# Patient Record
Sex: Male | Born: 1970
Health system: Southern US, Community
[De-identification: ages and names within clinical notes are randomized; demographics above are authoritative.]

## PROBLEM LIST (undated history)

## (undated) DIAGNOSIS — M1990 Unspecified osteoarthritis, unspecified site: Secondary | ICD-10-CM

## (undated) DIAGNOSIS — R51 Headache: Secondary | ICD-10-CM

## (undated) DIAGNOSIS — Z8672 Personal history of thrombophlebitis: Secondary | ICD-10-CM

## (undated) DIAGNOSIS — M179 Osteoarthritis of knee, unspecified: Secondary | ICD-10-CM

## (undated) DIAGNOSIS — G4733 Obstructive sleep apnea (adult) (pediatric): Secondary | ICD-10-CM

## (undated) DIAGNOSIS — G43909 Migraine, unspecified, not intractable, without status migrainosus: Secondary | ICD-10-CM

## (undated) DIAGNOSIS — G8929 Other chronic pain: Secondary | ICD-10-CM

## (undated) DIAGNOSIS — E669 Obesity, unspecified: Secondary | ICD-10-CM

## (undated) DIAGNOSIS — M545 Low back pain, unspecified: Secondary | ICD-10-CM

## (undated) DIAGNOSIS — R519 Headache, unspecified: Secondary | ICD-10-CM

## (undated) DIAGNOSIS — I10 Essential (primary) hypertension: Secondary | ICD-10-CM

## (undated) DIAGNOSIS — Z86718 Personal history of other venous thrombosis and embolism: Secondary | ICD-10-CM

## (undated) DIAGNOSIS — Z87442 Personal history of urinary calculi: Secondary | ICD-10-CM

## (undated) DIAGNOSIS — I82629 Acute embolism and thrombosis of deep veins of unspecified upper extremity: Secondary | ICD-10-CM

## (undated) DIAGNOSIS — M1710 Unilateral primary osteoarthritis, unspecified knee: Secondary | ICD-10-CM

## (undated) HISTORY — DX: Essential (primary) hypertension: I10

## (undated) HISTORY — PX: JOINT REPLACEMENT: SHX530

## (undated) HISTORY — PX: CARPAL TUNNEL RELEASE: SHX101

## (undated) HISTORY — PX: INCISION AND DRAINAGE OF WOUND: SHX1803

## (undated) HISTORY — PX: LITHOTRIPSY: SUR834

## (undated) HISTORY — PX: LUMBAR SPINE HARDWARE REMOVAL: SHX1987

## (undated) HISTORY — PX: LUMBAR DISC SURGERY: SHX700

## (undated) HISTORY — DX: Personal history of thrombophlebitis: Z86.72

## (undated) HISTORY — DX: Obesity, unspecified: E66.9

## (undated) HISTORY — DX: Acute embolism and thrombosis of deep veins of unspecified upper extremity: I82.629

## (undated) HISTORY — PX: POSTERIOR LUMBAR FUSION: SHX6036

## (undated) HISTORY — PX: BACK SURGERY: SHX140

---

## 2003-09-01 ENCOUNTER — Encounter: Admission: RE | Admit: 2003-09-01 | Discharge: 2003-09-01 | Payer: Self-pay | Admitting: Orthopedic Surgery

## 2003-09-15 ENCOUNTER — Encounter: Admission: RE | Admit: 2003-09-15 | Discharge: 2003-09-15 | Payer: Self-pay | Admitting: Orthopedic Surgery

## 2003-09-30 ENCOUNTER — Encounter: Admission: RE | Admit: 2003-09-30 | Discharge: 2003-09-30 | Payer: Self-pay | Admitting: Neurological Surgery

## 2003-10-21 ENCOUNTER — Inpatient Hospital Stay (HOSPITAL_COMMUNITY): Admission: RE | Admit: 2003-10-21 | Discharge: 2003-10-23 | Payer: Self-pay | Admitting: Neurological Surgery

## 2003-11-17 ENCOUNTER — Encounter: Admission: RE | Admit: 2003-11-17 | Discharge: 2003-11-17 | Payer: Self-pay | Admitting: Neurological Surgery

## 2003-12-08 ENCOUNTER — Encounter: Admission: RE | Admit: 2003-12-08 | Discharge: 2003-12-08 | Payer: Self-pay | Admitting: Neurological Surgery

## 2004-02-09 ENCOUNTER — Encounter: Admission: RE | Admit: 2004-02-09 | Discharge: 2004-02-09 | Payer: Self-pay | Admitting: Neurological Surgery

## 2004-03-21 ENCOUNTER — Encounter: Admission: RE | Admit: 2004-03-21 | Discharge: 2004-03-21 | Payer: Self-pay | Admitting: Neurological Surgery

## 2004-05-17 ENCOUNTER — Encounter: Admission: RE | Admit: 2004-05-17 | Discharge: 2004-05-17 | Payer: Self-pay | Admitting: Neurological Surgery

## 2004-07-11 ENCOUNTER — Encounter: Admission: RE | Admit: 2004-07-11 | Discharge: 2004-07-11 | Payer: Self-pay | Admitting: Neurological Surgery

## 2004-12-05 ENCOUNTER — Encounter: Admission: RE | Admit: 2004-12-05 | Discharge: 2004-12-05 | Payer: Self-pay | Admitting: Neurological Surgery

## 2006-07-02 ENCOUNTER — Ambulatory Visit: Admission: RE | Admit: 2006-07-02 | Discharge: 2006-07-02 | Payer: Self-pay | Admitting: Neurological Surgery

## 2006-08-24 ENCOUNTER — Inpatient Hospital Stay (HOSPITAL_COMMUNITY): Admission: RE | Admit: 2006-08-24 | Discharge: 2006-08-26 | Payer: Self-pay | Admitting: Neurological Surgery

## 2006-09-03 ENCOUNTER — Inpatient Hospital Stay (HOSPITAL_COMMUNITY): Admission: AD | Admit: 2006-09-03 | Discharge: 2006-09-07 | Payer: Self-pay | Admitting: Orthopedic Surgery

## 2006-09-18 ENCOUNTER — Encounter: Admission: RE | Admit: 2006-09-18 | Discharge: 2006-09-18 | Payer: Self-pay | Admitting: Neurological Surgery

## 2006-09-24 ENCOUNTER — Ambulatory Visit: Payer: Self-pay | Admitting: Cardiovascular Disease

## 2006-10-02 ENCOUNTER — Encounter: Admission: RE | Admit: 2006-10-02 | Discharge: 2006-10-02 | Payer: Self-pay | Admitting: Neurological Surgery

## 2006-11-05 ENCOUNTER — Ambulatory Visit: Payer: Self-pay

## 2006-11-05 ENCOUNTER — Ambulatory Visit: Payer: Self-pay | Admitting: Cardiovascular Disease

## 2006-11-05 LAB — CONVERTED CEMR LAB: INR: 1.9 (ref 0.9–2.0)

## 2006-12-19 ENCOUNTER — Ambulatory Visit: Payer: Self-pay

## 2007-04-24 ENCOUNTER — Ambulatory Visit: Payer: Self-pay | Admitting: Cardiovascular Disease

## 2007-04-24 ENCOUNTER — Ambulatory Visit: Payer: Self-pay

## 2007-06-17 ENCOUNTER — Ambulatory Visit: Payer: Self-pay | Admitting: Cardiology

## 2007-06-17 ENCOUNTER — Encounter: Admission: RE | Admit: 2007-06-17 | Discharge: 2007-06-17 | Payer: Self-pay | Admitting: Neurological Surgery

## 2007-06-17 ENCOUNTER — Ambulatory Visit: Payer: Self-pay

## 2007-09-17 ENCOUNTER — Encounter: Admission: RE | Admit: 2007-09-17 | Discharge: 2007-09-17 | Payer: Self-pay | Admitting: Neurological Surgery

## 2008-01-23 ENCOUNTER — Ambulatory Visit (HOSPITAL_COMMUNITY): Admission: RE | Admit: 2008-01-23 | Discharge: 2008-01-23 | Payer: Self-pay | Admitting: Neurological Surgery

## 2008-02-28 ENCOUNTER — Ambulatory Visit (HOSPITAL_COMMUNITY): Admission: RE | Admit: 2008-02-28 | Discharge: 2008-02-28 | Payer: Self-pay | Admitting: Neurological Surgery

## 2008-02-28 DIAGNOSIS — Z9889 Other specified postprocedural states: Secondary | ICD-10-CM | POA: Insufficient documentation

## 2008-04-24 HISTORY — PX: SHOULDER ARTHROSCOPY W/ ROTATOR CUFF REPAIR: SHX2400

## 2008-04-24 HISTORY — PX: FOREARM SURGERY: SHX651

## 2008-04-24 HISTORY — PX: HAND SURGERY: SHX662

## 2008-10-08 ENCOUNTER — Encounter: Admission: RE | Admit: 2008-10-08 | Discharge: 2008-10-08 | Payer: Self-pay | Admitting: Neurological Surgery

## 2008-12-16 DIAGNOSIS — Z8672 Personal history of thrombophlebitis: Secondary | ICD-10-CM | POA: Insufficient documentation

## 2008-12-16 DIAGNOSIS — G4733 Obstructive sleep apnea (adult) (pediatric): Secondary | ICD-10-CM | POA: Insufficient documentation

## 2008-12-16 DIAGNOSIS — I10 Essential (primary) hypertension: Secondary | ICD-10-CM | POA: Insufficient documentation

## 2008-12-16 DIAGNOSIS — M545 Low back pain, unspecified: Secondary | ICD-10-CM | POA: Insufficient documentation

## 2008-12-16 DIAGNOSIS — Z87898 Personal history of other specified conditions: Secondary | ICD-10-CM | POA: Insufficient documentation

## 2009-01-12 ENCOUNTER — Encounter: Admission: RE | Admit: 2009-01-12 | Discharge: 2009-01-12 | Payer: Self-pay | Admitting: Neurological Surgery

## 2010-01-10 ENCOUNTER — Encounter: Admission: RE | Admit: 2010-01-10 | Discharge: 2010-01-10 | Payer: Self-pay | Admitting: Neurological Surgery

## 2010-02-16 ENCOUNTER — Encounter: Admission: RE | Admit: 2010-02-16 | Discharge: 2010-02-16 | Payer: Self-pay | Admitting: Neurological Surgery

## 2010-03-25 ENCOUNTER — Ambulatory Visit (HOSPITAL_COMMUNITY)
Admission: RE | Admit: 2010-03-25 | Discharge: 2010-03-27 | Payer: Self-pay | Source: Home / Self Care | Admitting: Neurological Surgery

## 2010-03-30 ENCOUNTER — Encounter
Admission: RE | Admit: 2010-03-30 | Discharge: 2010-03-30 | Payer: Self-pay | Source: Home / Self Care | Admitting: Neurosurgery

## 2010-04-12 ENCOUNTER — Ambulatory Visit (HOSPITAL_COMMUNITY)
Admission: RE | Admit: 2010-04-12 | Discharge: 2010-04-12 | Payer: Self-pay | Source: Home / Self Care | Attending: Neurological Surgery | Admitting: Neurological Surgery

## 2010-04-12 ENCOUNTER — Encounter (INDEPENDENT_AMBULATORY_CARE_PROVIDER_SITE_OTHER): Payer: Self-pay | Admitting: Neurological Surgery

## 2010-04-28 ENCOUNTER — Ambulatory Visit
Admission: RE | Admit: 2010-04-28 | Discharge: 2010-04-28 | Payer: Self-pay | Source: Home / Self Care | Attending: Cardiovascular Disease | Admitting: Cardiovascular Disease

## 2010-05-23 ENCOUNTER — Other Ambulatory Visit: Payer: Self-pay | Admitting: Neurological Surgery

## 2010-05-23 DIAGNOSIS — M549 Dorsalgia, unspecified: Secondary | ICD-10-CM

## 2010-06-07 ENCOUNTER — Ambulatory Visit
Admission: RE | Admit: 2010-06-07 | Discharge: 2010-06-07 | Disposition: A | Discharge status: Home / Self Care | Payer: BC Managed Care – PPO | Source: Ambulatory Visit | Attending: Neurological Surgery | Admitting: Neurological Surgery

## 2010-06-07 DIAGNOSIS — M549 Dorsalgia, unspecified: Secondary | ICD-10-CM

## 2010-06-07 MED ORDER — GADOBENATE DIMEGLUMINE 529 MG/ML IV SOLN
20.0000 mL | Freq: Once | INTRAVENOUS | Status: AC | PRN
  Administered 2010-06-07: 20 mL via INTRAVENOUS

## 2010-07-04 LAB — POCT I-STAT 4, (NA,K, GLUC, HGB,HCT)
Glucose, Bld: 141 mg/dL — ABNORMAL HIGH (ref 70–99)
HCT: 40 % (ref 39.0–52.0)
Hemoglobin: 13.6 g/dL (ref 13.0–17.0)

## 2010-07-04 LAB — TYPE AND SCREEN

## 2010-07-05 LAB — DIFFERENTIAL
Basophils Relative: 1 % (ref 0–1)
Eosinophils Relative: 8 % — ABNORMAL HIGH (ref 0–5)
Lymphs Abs: 2.5 10*3/uL (ref 0.7–4.0)
Monocytes Absolute: 0.4 10*3/uL (ref 0.1–1.0)
Monocytes Relative: 5 % (ref 3–12)
Neutrophils Relative %: 57 % (ref 43–77)

## 2010-07-05 LAB — BASIC METABOLIC PANEL
BUN: 7 mg/dL (ref 6–23)
CO2: 27 mEq/L (ref 19–32)
Chloride: 103 mEq/L (ref 96–112)
Creatinine, Ser: 0.81 mg/dL (ref 0.4–1.5)
GFR calc non Af Amer: >60 mL/min (ref >60)

## 2010-07-05 LAB — CBC
HCT: 44.8 % (ref 39.0–52.0)
Hemoglobin: 14.9 g/dL (ref 13.0–17.0)
MCHC: 33.3 g/dL (ref 30.0–36.0)
Platelets: 167 10*3/uL (ref 150–400)
RDW: 13 % (ref 11.5–15.5)

## 2010-07-05 LAB — PROTIME-INR
INR: 1.06 (ref 0.00–1.49)
Prothrombin Time: 14 seconds (ref 11.6–15.2)

## 2010-07-05 LAB — APTT: aPTT: 26 seconds (ref 24–37)

## 2010-07-05 LAB — SURGICAL PCR SCREEN: Staphylococcus aureus: POSITIVE — AB

## 2010-09-06 NOTE — Assessment & Plan Note (Signed)
[facility]                            CARDIOLOGY OFFICE NOTE   NAME:Joshua [name], Joshua [name]                       MRN:          [id number]  DATE:04/24/2007                            DOB:          1970/08/12    Joshua [name] returns today for followup.  He has had [name] right upper extremity  DVT.  This was in association with [name] right upper extremity medial PICC  line placed after back surgery.  I have been following him.  He has had  about 6 months of Coumadin.  He had [name] duplex today which I personally  read, time 10 minutes.  He has residual thrombus which is low in the arm  with resolution of all upper arm thrombus.  In particular, the  subclavian, axillary and brachial are free of thrombus.  He did have [name]  small thrombus burden residual in the basilic vein.   I told Joshua [name] that since the PICC line is now out and he has no proximal  thrombus that I would probably stop his Coumadin in March.  This will be  nine full months of anticoagulation   He is coming up from [facility] February 23 to see Joshua [name] about his  back.  I am not in the office at that time but I told him I would have  Joshua [name] see him and I could read his duplex study and we will make  [name] decision at the beginning of March, probably to stop his Coumadin   In general, the patient's upper extremity swelling has gone.  He has not  any significant chest pain.  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  His  review of systems remarkable for [name] significant headache.  He does get  migraines.  He usually takes Imitrex for this.   He has not had any shortness of breath, palpitations, evidence of PE, or  recurrent swelling in the right upper extremity.  He does continue have  chronic back problems and needs followup for this.   His review of systems otherwise negative.   His medications include Coumadin as directed by our clinic, Toprol-XL 50  [name] day, lisinopril 10 [name] day.   EXAMINATION TODAY:  Remarkable for [name]  jovial and slightly overweight  white male in no distress.  His blood pressure is 130/80 with [name] large  cuff in the left arm.  I did not take his blood pressure in the right  arm due to the history of DVT and basilic clot.  His heart rate is 70,  afebrile, respiratory rate 14, weight 280.  HEENT:  Unremarkable.  NECK:  Supple.  No lymphadenopathy, thyromegaly or JVP elevation.  No  fullness in the supraclavicular fossa.  LUNGS:  Clear, good diaphragmatic motion.  No wheezing.  S1, S2, with normal heart sounds.  PMI normal.  ABDOMEN:  Benign.  Bowel sounds positive.  No AAA, no tenderness, no  hepatosplenomegaly or hepatojugular reflux.  Distal pulses are intact, no edema.  NEUROLOGIC:  Nonfocal.  No muscular weakness.  The right upper extremity  shows no residual tenderness or swelling.  IMPRESSION:  1. Right upper extremity deep vein thrombosis, minimal thrombus burden      in the basilic vein and the antecubital fossa, likely to stop      Coumadin in March.  Followup duplex then.  We will then switch him      to an aspirin [name] day.  2. Hypertension, currently well controlled.  Continue lisinopril 10 mg      [name] day, low-salt diet.  3. Migraines.  Continue beta-blocker and Toprol-XL, which also helps      his blood pressure, Imitrex p.r.n.  Follow up with primary care      doctor.  4. Chronic back pain.  Follow up with Joshua [name] in February.  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   Overall, I think Joshua [name] is doing well and I think there is [name] 99% chance  we will stop his Coumadin in March.     Joshua [name]. Joshua Emms, MD, [facility]  Electronically Signed    PCN/MedQ  DD: 04/24/2007  DT: 04/24/2007  Job #: [id number]

## 2010-09-06 NOTE — Op Note (Signed)
NAMESHAWNTE, DEMAREST                ACCOUNT NO.:  [id number]   MEDICAL RECORD NO.:  [id number]          PATIENT TYPE:  AMB   LOCATION:  SDS                          FACILITY:  [facility]   PHYSICIAN:  Tia Alert, MD     DATE OF BIRTH:  1971-02-02   DATE OF PROCEDURE:  01/23/2008  DATE OF DISCHARGE:  01/23/2008                               OPERATIVE REPORT   PREOPERATIVE DIAGNOSIS:  Right carpal tunnel syndrome.   POSTOPERATIVE DIAGNOSIS:  Right carpal tunnel syndrome.   PROCEDURE:  Right carpal tunnel release.   SURGEON:  Tia Alert, MD   ASSISTANT:  None.   ANESTHESIA:  General endotracheal.   COMPLICATIONS:  None apparent.   INDICATION FOR THE PROCEDURE:  Mr. Burgard is a 40 year old gentleman who  complained of bilateral numbness in his hands.  He had nerve conduction  studies, which showed severe bilateral median neuropathies.  We  recommended a serial carpal tunnel release starting with the right side.  He understood the risks, benefits, and expected outcome and wished to  proceed.   DESCRIPTION OF THE PROCEDURE:  The patient was taken to the operating  room.  After induction of adequate generalized endotracheal anesthesia,  he was placed in a supine position with his right arm extended on an arm  board.  His arm was prepped circumferentially from the fingertips to the  axilla utilizing DuraPrep and then draped in the usual sterile fashion.  An 8 mL of local anesthesia was injected and a small palmar incision was  made in the palm from the distal wrist crease into the palm in line with  the middle finger.  It was carried down through the palmar fascia and  then the transverse carpal ligament was identified and opened in a  linear fashion with a #15 blade scalpel to expose the underlying median  nerve.  I then spread between the median nerve and the transverse carpal  ligament proximally under the distal wrist crease releasing the  transverse carpal ligament with micro  Metzenbaum scissors until it was  completely released.  I then palpated with the curved hemostat to assure  that I had complete transection of the transverse carpal ligament  proximally into the wrist.  I then went distally into the palm  transecting the transverse carpal ligament as I went until I reached the  palmar fat and the transverse carpal ligament was completely transected.  I then palpated along the nerve to assure that I had the transverse  carpal ligament fully released.  I then inspected the nerve, it looked  healthy and free.  I irrigated with saline solution, dried all bleeding  points, and then closed the palmar fascia with a single 3-0 Vicryl,  closed the subcutaneous tissues with 3-0 Vicryl sutures, and then  closed the skin with interrupted 4-0 Ethilon vertical mattress sutures.  The hand was then wrapped in a Kerlix and Ace bandage.  The patient was  then awakened from anesthesia and taken to the recovery room in stable  condition.  At the end of the procedure, all sponge, needle,  and  instrument counts were correct.      Tia Alert, MD  Electronically Signed     DSJ/MEDQ  D:  01/23/2008  T:  01/24/2008  Job:  [id number]

## 2010-09-06 NOTE — Assessment & Plan Note (Signed)
[facility]                            CARDIOLOGY OFFICE NOTE   NAME:[name], [name]                       MRN:          [id number]  DATE:06/17/2007                            DOB:          17-Jul-1970    Joshua [name] is an extremely pleasant 40 year old gentleman who is  typically followed by Dr. Eden [name].  He has a history of a right upper  extremity DVT occurring in the setting of a PICC line after back  surgery.  Dr. Eden [name] has been seeing him on a  routine basis and  ordering followup ultrasounds to reassess the clot burden.  He had an  ultrasound earlier today.  Note, since he was last seen, he denies any  dyspnea, chest pain, palpitations or syncope and there is no upper  extremity or lower extremity swelling.   PRESENT MEDICATIONS:  Include Coumadin as directed and followed by his  primary care physician, Toprol 50 mg daily, lisinopril 10 mg daily.   PHYSICAL EXAM:  Today shows a blood pressure 137/82.  His pulse is 86.  Weighs 296 pounds.  His HEENT is normal.  Neck is supple.  CHEST:  Clear.  CARDIOVASCULAR:  Regular rate.  ABDOMEN: Shows no tenderness.  His UPPER EXTREMITIES:  Show no edema and there are no cords palpated.  There are also no cords palpated in his lower extremities.   His electrocardiogram shows a sinus rhythm at a rate of 74.  The axis is  normal.  There are no ST changes noted.   We will also have a preliminary report from an upper extremity  ultrasound done earlier today.  There is residual nonocclusive thrombus  in a 6-8 cm segment of the basilic vein in the mid upper arm with  evidence of recanalization.   DIAGNOSES:  1. Upper extremity deep vein thrombosis - We will continue with his      Coumadin for now.  I will ask Dr. Eden [name] to review the upper      extremity Dopplers performed earlier today and then to contact the      patient about the duration of his Coumadin.  Dr. Eden [name] will also      inform him of his  next followup visit.  2. Hypertension - Blood pressure is adequately control.  3. History of migraine headaches - He will continue on his beta      blocker.  4. History of chronic back pain - Per his primary care physician.     Joshua Frieze [name], MD, [facility]  Electronically Signed   BSC/MedQ  DD: 06/17/2007  DT: 06/17/2007  Job #: [id number]

## 2010-09-06 NOTE — Op Note (Signed)
NAMESHERMAN, DONALDSON                ACCOUNT NO.:  [id number]   MEDICAL RECORD NO.:  [id number]          PATIENT TYPE:  AMB   LOCATION:  SDS                          FACILITY:  [facility]   PHYSICIAN:  Tia Alert, MD     DATE OF BIRTH:  03-13-1971   DATE OF PROCEDURE:  02/28/2008  DATE OF DISCHARGE:                               OPERATIVE REPORT   PREOPERATIVE DIAGNOSIS:  Left carpal tunnel syndrome.   POSTOPERATIVE DIAGNOSIS:  Left carpal tunnel syndrome.   PROCEDURE:  Left carpal tunnel release.   SURGEON:  Tia Alert, MD   ANESTHESIA:  Local MAC.   COMPLICATIONS:  None apparent.   INDICATIONS FOR PROCEDURE:  Mr. Carras is a very pleasant 40 year old  gentleman who presented with numbness and tingling in both hands.  He  was found to have severe carpal tunnel syndrome or median neuropathy  bilaterally.  He underwent a right carpal tunnel release about 6 weeks  ago.  He returns today for serial release of the left median nerve.  He  understands risks, benefits, expected outcome, and wishes to proceed.   DESCRIPTION OF PROCEDURE:  The patient was taken operating room and his  arm was extended on an arm board to the left.  His arm was prepped  circumferentially with DuraPrep from the fingertips to the axilla with  DuraPrep and then draped in the usual sterile fashion.  10 mL local  anesthesia was injected and a small palmar incision was made from the  distal wrist crease into the palm in line with the middle finger and  taken down through the palmar fascia.  Self-retaining retractor was  placed.  I then used the 15-blade scalpel to dissect down through the  transverse carpal ligament, it was quite thickened until the underlying  median nerve was exposed and I spread between the median nerve and the  transcarpal ligament with a mosquito dissector and continued to transect  the transcarpal ligament distally into the palm until the palmar fat was  reached and the transverse  carpal ligament was completely transected.  The underlying median nerve looked quite healthy and free.  I then did  the same thing more proximally under the wrist crease dissecting with  the mosquito and cutting the transcarpal ligament with micro Metzenbaum  scissors until it was completely released.  I then palpated with a  mosquito along the nerve each way to assure adequate decompression and  the nerve was quite free.  I then dried all bleeding points with bipolar  cautery.  I then closed the palmar fascia with a single 3-0 Vicryl  suture.  I then closed the subcuticular tissue with 3-0 Vicryl  suture and closed the skin with interrupted 4-0 Ethilon vertical  mattress sutures.  The hand was then wrapped in a Kerlix and an Ace  bandage, and the patient was taken to recovery room in stable condition.  At the end of the procedure, all sponge, needle, and instrument counts  were correct.      Tia Alert, MD  Electronically Signed  DSJ/MEDQ  D:  02/28/2008  T:  02/28/2008  Job:  [id number]

## 2010-09-06 NOTE — Assessment & Plan Note (Signed)
[facility]ALTHC[facility]                            CARDIOLOGY OFFICE NOTE   NAME:[name], [name]                       MRN:          [id number]  DATE:11/05/2006                            DOB:          [birth year]    Joshua [name] returns today for followup.  I initially saw him in June.  At  that time he had complication from a PICC line.  The PICC line had been  placed after previous back surgery.   He had a right upper extremity DVT.  Followup duplex in our office  showed some resolution of the right upper extremity DVT.  There was  subacute thrombus in the brachial vein at the level of the antecubital  fossa which was a new finding.   I told the patient he would be on Coumadin for at least 6 months to a  year.   He since then has had followup at Dr. Milta Deiters office in [place].  We  will check his pro time today.  He was also somewhat hypertensive, and  we started him on lisinopril 10 mg a day.   Currently he is doing well.  He continues to have some swelling and pain  in the right arm, but it has improved.   REVIEW OF SYSTEMS:  Otherwise negative.  In particular, he has not had  any significant shortness of breath or pleuritic chest pain.   PHYSICAL EXAMINATION:  VITAL SIGNS:  His weight today is 280.  Blood  pressure is 128/81, pulse 66, respiratory rate 14.  He is afebrile.  HEENT:  Normal.  NECK:  Carotids without bruit.  There is no JVP elevation, no  lymphadenopathy, no thyromegaly.  LUNGS:  Clear with good diaphragmatic motion, no wheezing.  CARDIAC:  There is normal S1 and S2 with normal heart sounds.  PMI is  normal.  ABDOMEN:  Benign.  There is no tenderness, no hepatosplenomegaly, no  hepatojugular reflux, no AAA.  Bowel sounds are positive.  EXTREMITIES:  Femoral pulses +3 bilaterally without bruits.  Distal  pulses intact with no edema.  NEUROLOGIC:  Nonfocal.  There is no muscular weakness.   MEDICATIONS:  1. He is currently on Coumadin as  directed.  2. Toprol 50 a day.  3. Lisinopril 10 a day.   IMPRESSION:  1. Deep vein thrombosis in the right arm, resolving.  Need for      Coumadin for another 6-12 months.  Check INR today and then follow      up with Dr. Milta Deiters office.  I will see him back in 3 months.  2. Hypertension.  Continue lisinopril and Toprol with low-salt diet.      Blood pressure currently well controlled at 128/81 in the office      today.  His pulse was 66.  Overall, I think that he has improved      and will continue this medication.  3. Chronic lower back problems.  He is to follow up with his surgeon      who did the disk surgery on him.  Apparently his hardware has been  removed, and he will need surveillance for repeat infections.   I will see the patient back in 3 months to further assess his blood  pressure and resolution of the DVT.     [name]. Eden Emms, MD, [facility]  Electronically Signed    PCN/MedQ  DD: 11/05/2006  DT: 11/05/2006  Job #: [id number]

## 2010-09-06 NOTE — Assessment & Plan Note (Signed)
[facility]                            CARDIOLOGY OFFICE NOTE   NAME:Joshua [name], Joshua [name]                       MRN:          [id number]  DATE:09/24/2006                            DOB:          02-12-1971    REFERRING PHYSICIAN:  Dr. [name]   The patient is [name] 40 year old patient.  As far as I can tell he was  referred for right upper extremity DVT.  I think the real issue here is  following his Coumadin levels.   The patient has [name] longstanding history of chronic lumbar problems.  On  May 2, he had lumbar disk surgery by Dr. [name] and this is his fourth or  fifth back surgery.   He subsequently had an infection.  The wound was debrided and he was  given [name] PICC line in the right upper extremity for 4 weeks for  antibiotics.  He subsequently developed [name] DVT.  I have [name] report from  [facility] indicating [name] significant right upper extremity DVT  involving the cephalic axial and brachial veins.   The patient had PICC line removed.  He was given Lovenox for [name] week.  He  was then given 5 mg of Coumadin.  He has had home health nurse check his  Coumadin level in [place].  He tells me that the last level was low at  1.6.   The patient lives in [place], but it is not really convenient to come  up here to [facility] to our Coumadin clinic and we will try to arrange  for the home health nurse to continue to check his Coumadin levels and  call the results to [place].   In regards to his right upper extremity DVT, he continues to have  significant swelling and pain here.  He has been putting heat on it at  night.  There has been no evidence for pleuritic pain, PE, shortness of  breath, PND, or orthopnea.  There are no other cardiac problems.   REVIEW OF SYSTEMS:  Remarkable for some fatigue, otherwise negative  except for significant right arm pain and his chronic back pain.   ROS:  Otherwise negative   PAST MEDICAL HISTORY:  Remarkable for multiple  disk surgeries.  The  first was in 1987 with an L3-4 fusion.  In 2005 he had ruptured disk  surgery with pins in the back.  He subsequently had hardware removed in  May of 2008 and an infection with wound repair on Sep 03, 2006.  Past  medical history is otherwise remarkable for some high blood pressure.  He has been on [name] medicine for this over the last few years.  There is no  history of coronary artery disease or valvular heart disease.   The patient is happily married.  He has three boys that he enjoys  watching baseball tournaments with.  He is [name] Naval architect.  He is out of  work currently.  He does not smoke or drink.  His wife's health is  fairly good.   FAMILY HISTORY:  Remarkable for mother and father being alive without  premature coronary artery disease.   ALLERGIES:  He denies any allergies.   MEDICATIONS:  1. Coumadin 5 mg [name] day.  2. Keflex 500 mg b.i.d.  3. Toprol 50 mg [name] day.  4. Lisinopril 10 mg [name] day.   PHYSICAL EXAMINATION:  GENERAL:  [name] healthy-appearing, young white male  in no distress.  Affect is appropriate.  VITAL SIGNS:  Blood pressure 140/80, pulse is elevated at 90,  respiratory rate is 14.  He is afebrile.  Weight is 270.  HEENT:  Normal.  There is no JVP elevation, no thyromegaly, and no  lymphadenopathy.  LUNGS:  Clear without wheezing.  Normal diaphragmatic motion.  HEART:  Normal S1 and S2 with normal heart sounds.  PMI is normal.  ABDOMEN:  Benign.  There is no hepatosplenomegaly, no hepatojugular  reflux, no AAA.  Bowel sounds positive.  There is no tenderness.  EXTREMITIES:  Femorals were +3 bilaterally without bruits.  PT's are +3  bilaterally.  There is no lower extremity edema or cords.  No evidence  of lower extremity DVT or varicosities.  NEUROLOGY:  Nonfocal.  MUSCULOSKELETAL:  No weakness.   The right upper extremity continues to be extremely painful,  particularly medially in the axilla area and also in the brachial area.  There is no  active sign of infection at the old PICC line site.  His  right radial pulse is +3.  He has tenderness to extension in the right  upper extremity.   His EKG is normal except for tachycardia at rate of 110.   IMPRESSION:  Problem 1.  Right upper extremity deep venous thrombosis  related to PICC line.  Primary issue is Coumadin follow-up.  His INR has  clearly been low.  He will have it checked today.  I suspect we will  need to increase him to 5 alternating with 7.5.  I will try to get his  home health nurse in [place] to continue to check his pro-time on [name]  weekly basis at home.  If his INR is less than 2 he will be given  another prescription for 1 mg/kg subcu b.i.d. of Lovenox.   The patient understands the need for good anticoagulation and risks for  pulmonary embolism.  I will see him back in 4-6 weeks and we will repeat  his right upper extremity Duplex to see if there has been resolution of  his thrombus and make sure there is no proximal extension.  On physical  examination, the only thing I am [name] little concerned about is his  tachycardia.   Further recommendations will be based on his pro-time results today.  Again, I think he needs to be on [name] higher dose of Coumadin and we will  try to arrange follow up of his INR's.   Problem 2.  Hypertension currently well controlled.  He may need [name]  higher dose of beta blocker.  He will continue his ACE inhibitor and we  will see what his heart rate does the next time we see him.   His general care for his chronic lower back problems will be continued  to be given by Dr. [name].     [name]. Eden Emms, MD, [facility]  Electronically Signed    PCN/MedQ  DD: 09/24/2006  DT: 09/24/2006  Job #: [id number]

## 2010-09-06 NOTE — Op Note (Signed)
NAMEHOMERO, Joshua [name]                ACCOUNT NO.:  [id number]   MEDICAL RECORD NO.:  [id number]          PATIENT TYPE:  INP   LOCATION:  6737                         FACILITY:  [facility]   PHYSICIAN:  Tia Alert, MD     DATE OF BIRTH:  1970/10/19   DATE OF PROCEDURE:  09/03/2006  DATE OF DISCHARGE:                               OPERATIVE REPORT   PREOPERATIVE DIAGNOSIS:  Postoperative lumbar wound fluid collection.   POSTOPERATIVE DIAGNOSIS:  Postoperative lumbar wound fluid collection  with superficial suture abscess at the superior part of the incision.   PROCEDURE:  Irrigation debridement of lumbar wound with removal of  serous fluid collection, culture of the wound and placement of  superficial and deep lumbar drains.   SURGEON:  Dr. Marikay Alar.   ASSISTANT:  Dr. Altamease Oiler.   ANESTHESIA:  General endotracheal.   COMPLICATIONS:  None apparent.   FINDINGS AT SURGERY:  On the initial incision there was exudate from the  very superficial top part of the incision.  This was cultured and sent  before antibiotics were given.  The incision was then opened and there  was release of serous fluid from the deeper tissues.  There was a lot of  dead space between the subcutaneous tissues and the fascia in between  the fascia and the scar tissue overlying the dura below.  The deeper  tissues appeared to be healthy and bleeding.  There was no evidence of  obvious infected tissue there.  We performed a Valsalva maneuver and saw  no evidence of CSF leak at any time during the surgery.  This was felt  to represent a serous fluid collection possibly from an inflammatory  reaction or it could be early wound infection.  Superficial and deep  cultures were sent.   INDICATIONS FOR PROCEDURE:  Joshua [name] is a 40 year old gentleman who  underwent a lumbar exploration with removal of hardware from L2-L5 with  a decompressive laminectomy at L1-2 and placement of Actifuse at L2-3  about 12 days ago.   About 5 days ago he presented with a superficial  fluid collection and the top part of the incision.  This was tapped and  70-75 mL of serous fluid was removed at that time. It was a dark red  wine color at that time.  He noticed a return of the fluid the following  day.  He then starting getting fevers over the weekend up to 102 without  drainage from the incision itself.  The incision looked pristine on  examination today in the office but he had a larger fluid collection.  This was tapped once again and 60 mL of a dark red wine-colored fluid  was again removed but he was more tender today and was febrile again.  He did not have any redness of the incision and no obvious frank  infection or dehiscence of the incision.  It was felt that he was best  treated by bringing him back to the operating room for exploration of  the wound to make sure this did not represent a CSF  leak or a deep wound  infection.  This would also allow underwent to culture the tissues  before antibiotics were given especially given the fact that he was now  having fevers.  He did have some headache but not necessarily postural  in nature.  We felt the potential risk of the surgery included but were  not limited to bleeding, infection and lack of relief of symptoms.  We  felt was the benefits of the surgery was we could place drains once  again and leave these in for an extended period time to try to prevent  reaccumulation of this serous fluid.  He understood the risks, benefits,  expected outcome and agreed to proceed.   DESCRIPTION OF PROCEDURE:  The patient was taken to operating room after  induction of adequate generalized endotracheal anesthesia he was rolled  in the prone position on the Wilson frame and all pressure points were  padded.  The incision was prepped with DuraPrep and draped in usual  sterile fashion.  The superior three-quarters of his incision was  reopened.  There was a small release of pus  from the very top of the  incision and this was felt to be a very superficial stitch abscess.  This was cultured.  We then opened the incision further there was a  release of fluid from the tissue between the subcutaneous tissues and  the fascia.  The fascia was opened very easily with a finger sweep and  there was release of more serous fluid.  The wound was inspected.  I  found no evidence of infection.  There was no pus the tissues were  healthy-looking.  I debrided the tissue somewhat just to get back to a  nice healthy bleeding looking tissues with the tissues looked quite  good.  We Valsalva the patient to make sure there was no CSF leak.  We  found none and then irrigated with 2 liters of bacitracin containing  saline solution by lavaging the wound and then placed two medium Hemovac  drains through separate stab incisions to run deep in the incision and  then closed the muscle as best I could to try to limit dead space.  I  then closed the fascia with 0-0 Vicryl.  I then placed another medium  Hemovac drain on top of the fascia and then closed the subcutaneous  tissues with 2-0 Vicryl and subcuticular tissues with 3-0 Vicryl.  The  drains were hooked to their Hemovacs.  The skin was closed with Benzoin  and Steri-Strips.  Drapes removed.  A sterile dressing was applied.  The  patient was awakened from general anesthesia and transferred recovery  room in stable condition.  At the end of the procedure all sponge,  needle and instrument counts were correct.      Tia Alert, MD  Electronically Signed     DSJ/MEDQ  D:  09/03/2006  T:  09/04/2006  Job:  [id number]

## 2010-09-06 NOTE — Op Note (Signed)
NAMEADA, WOODBURY                ACCOUNT NO.:  [id number]   MEDICAL RECORD NO.:  [id number]          PATIENT TYPE:  INP   LOCATION:  3172                         FACILITY:  [facility]   PHYSICIAN:  Tia Alert, MD     DATE OF BIRTH:  Aug 19, 1970   DATE OF PROCEDURE:  08/24/2006  DATE OF DISCHARGE:                               OPERATIVE REPORT   PREOPERATIVE DIAGNOSIS:  Back pain, status post instrumented fusion L2  to L5, with adjacent level stenosis, L1-2, with possible  pseudoarthrosis, L2-3.   POSTOPERATIVE DIAGNOSIS:  Back pain, status post instrumented fusion L2  to L5, with adjacent level stenosis, L1-2, with possible  pseudoarthrosis, L2-3, solid fusion suspected at L2-3.   PROCEDURES:  1. Exploration of fusion, L2 to L5.  2. Removal of segmental fixation, L2 to L5.  3. Decompressive laminectomy, L1-2 on the left, followed by a      sublaminar decompression, L1-2, for central canal decompression.  4. Intertransverse arthrodesis, L2,-3 utilizing Actifuse putty.   SURGEON:  Tia Alert, MD   ASSISTANT:  Kathaleen Maser. Pool, MD   ANESTHESIA:  General endotracheal.   COMPLICATIONS:  None apparent.   FINDINGS AT SURGERY:  There was a suspected pseudoarthrosis at L2-3.  However, we then inspected our fusion from L2 to L5.  We found no  pseudoarthrosis at L2-3.  We did find adjacent level stenosis at L1-2,  which was addressed with a decompressive laminectomy.   INDICATIONS FOR PROCEDURE:  Mr. Lavallee is a 40 year old gentleman who  about 2-1/2 years ago underwent a decompressive laminectomy with to TLIF  F L2-L3, L3-4 and or L4-5, followed by segmental fixation L2 to L5.  He  initially did quite well and over time developed chronic back pain.  He  had imaging which showed what was felt to be solid fusions at L3-4  and  L4-5 with possible pseudoarthrosis at L2-3 with some lucency around the  L2 screw on the right side.  He tried medical management for some time  without  significant relief and was on chronic narcotic pain medications.  I recommended a lumbar re-exploration with the removal of the hardware  and exploration of the fusion of L2-3.  His preoperative scan showed  adjacent level stenosis at L1-2.  I recommended that we address this  with a laminectomy at L1-2.  He understood the risks, benefits and  suspected outcome of this procedure and wished to proceed in hopes of  improving his pain situation.   DESCRIPTION OF PROCEDURE:  The patient was taken to the operating room  and after induction of adequate generalized endotracheal anesthesia, he  was rolled into the prone position on chest rolls and all pressure  points were padded.  His lumbar region was prepped with DuraPrep and  then draped in the usual sterile fashion.  Ten milliliters of local  anesthesia was injected and his old incision was ellipsed out.  The  incision was carried down to the fascia.  I took down the fascia and the  paraspinous musculature at L1 to expose L1-2 region, then carried  this  dissection out laterally until I found the L2 screw and then followed  this down, exposing the pedicle, screw and rod construct all the way  down to the L5 pedicle screws.  Self-retaining retractors were placed.  He had significant bony overgrowth over the pedicle screw at L2 on the  right, over the rod on both sides, and over the superior cross-link  between L2-3.  This had to be removed with a Leksell rongeur.  I was  then able to remove the locking caps from each of the screws, loosen the  transverse connectors and remove those.  I was then able to remove bony  overgrowth from the surface of the rod and remove the rods.  I then  tested each screw.  Each screw seemed to have a tight fit except the L2  screw on the right, which was somewhat loose in its pedicle.  I removed  the pedicle screws at L4 and L5 bilaterally.  I then tested each screw  at L2 and L3 by pulling on each screw and with  pulling on each  individual screw, all four screws moved as a single unit.  I then took  down the musculature lateral to the screws, found the intertransverse  arthrodesis, coagulated the soft tissues over this to expose the entire  arthrodesis laterally at L2-3.  I found no crack in the arthrodesis as  was suspected on the preoperative CT scan.  It appeared to be solid  completely from L2 to L3 bilaterally, and again testing the screws by  pulling individually on each screw made the four screws move as a single  unit.  Therefore, I felt like we had a solid arthrodesis at L2-3.  I did  expose the transverse processes expecting to place Actifuse out over  these to help ensure that we had a solid fusion at this level.  Once the  hardware was removed at L2 and L3, I then used the Leksell rongeur to  remove the soft tissues, exposing the lamina of L1, and then performed  an L1-2 hemilaminectomy on the left side, decompressing the lateral  recess and following this down to the superior part of scar tissue until  I could palpate the pedicle of L2, and once I was in the inferior part  of that pedicle stopped my decompression.  I also drilled up under the  spinous process and performed a sublaminar decompression, again  decompressing until I got down to the L2 pedicle and just below the L2  pedicle and once the guide below the L2 pedicles, I felt like I had a  nice decompression of the central canal.  I then irrigated with saline  solution containing bacitracin, tried to dry all bleeding points as best  as possible, lined the dura with Gelfoam, decorticated the transverse  processes bilaterally at L2-3, placed Actifuse out over these to perform  intertransverse arthrodesis, L2-3, again then tried to dry all bleeding  points, placed two medium Hemovac drains through separate stab incisions  and then closed the muscle and fascia with 0 Vicryl, closed the subcutaneous and subcuticular tissues with  2-0 and 3-0 Vicryl, and  closed the skin with Benzoin Steri-Strips.  The drapes were removed.  A  sterile dressing was applied.  The patient was awakened from anesthesia  and transported to the recovery room in stable condition.  At the end of  procedure all sponge, needle and instrument counts were correct.      Onalee Hua  Barnett Applebaum, MD  Electronically Signed     DSJ/MEDQ  D:  08/24/2006  T:  08/24/2006  Job:  [id number]

## 2010-09-06 NOTE — Letter (Signed)
April 28, 2010    Dr. Brent Bulla  [address]  [place], [place] [address]   RE:  Joshua [name], Joshua [name]  MRN:  [id number]  /  DOB:  11/17/1970   Dear Dr. Marina Goodell:   Thank you for referring Joshua [name] for further evaluation regarding right  upper extremity superficial vein thrombosis.  As you are aware, this is  a pleasant 40 year old gentleman with the following problem list:   1. History of chronic back problems that required multiple back      surgeries in the past most recently last month.  2. History of right upper extremity deep vein thrombosis in 2008 after      a PICC line placement which was treated with about 9 months of      anticoagulation with warfarin.  3. Hypertension.  4. Obesity.   CLINICAL HISTORY:  Mr. [name] has extensive back problems that required  multiple surgeries on his back most recently in early December by Dr.  [name] in [place].  At that time it was complicated with possible  postoperative epidural hematoma that required  exploration with removal  of a small hematoma.  He did fine and was discharged home.  About a week  after that he noticed slight discomfort in his right arm which was  tender to touch with some swelling below the elbow.  Due to his previous  history of DVT in that arm he was concerned, and he went back and saw  Dr. [name] who ordered an ultrasound Doppler.  The ultrasound showed  acute thrombus in the right cephalic and basilic veins that does not  involve the deep veins.  This was discovered on December 20.  Since then  actually the swelling and discomfort have improved on its own.  He is  completely functional in that arm, and does not seem to be tender to  touch.  He denies any dyspnea.  He does not have any other associated  symptoms.  He has no previous history of DVTs in the lower extremities.  There is no history of cancer or pulmonary embolus.   MEDICATIONS:  None.   SOCIAL HISTORY:  Is negative for smoking, alcohol or recreational  drug  use.  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   FAMILY HISTORY:  Negative for coronary artery disease.   REVIEW OF SYSTEMS:  This is remarkable for chronic back pain.  There is  slight discomfort in the right upper extremities which has improved.  A  full review of system was performed, and is, otherwise, negative.   PHYSICAL EXAMINATION:  GENERAL:  The patient is obese who is in no acute  distress.  Weight is 289 pounds.  HEENT:  Normocephalic, atraumatic.  NECK:  No JVD or carotid bruits.  RESPIRATORY:  Normal respiratory effort with no use of accessory  muscles.  Auscultation reveals normal breath sounds.  CARDIOVASCULAR:  Normal PMI.  Normal S1 and S2 with no gallops or murmurs.  ABDOMEN:  Benign, nontender, nondistended.  EXTREMITIES:  With no clubbing, cyanosis or edema.  Upper extremities:  The right upper extremity is not swollen.  It appears to be the same  size as the left upper extremity.  It is not tender to touch.  Radial  pulse is slightly reduced on the right side.  Palpation of the muscles  and veins does not reveal tenderness.  PSYCHIATRIC:  He is alert, oriented x3 with normal mood and affect.   IMPRESSION:  1. Thrombosis of the superficial veins of the upper extremity:  This      does not involve the deep veins as the brachial, axillary, and      subclavian are all patent with no evidence of clot.  His previous      clot in 2008 involved the axillary and the brachial veins as well.      The current veins that are involved are the cephalic and basilic or      superficial veins.  Actually even after treatment with      anticoagulation in 2008, a follow-up Doppler in 2009 showed      significant residual thrombus in the basilic vein.  Thus, I think      some of this might be actually old thrombus.  He also might have      substrate for future thrombus formation in that area.  Currently      his symptoms improved spontaneously, and by  physical exam I really      do not see any signs of swelling or discomfort.  I am concerned to      put him on anticoagulation given his recent back surgery that was      complicated by an epidural hematoma.  I think the risk outweighs      the benefit in this situation especially with improvement in his      symptoms and the fact that thrombus does not involve deep veins.      Thus I recommend aspirin 325 mg once daily.  I will plan on repeat      ultrasound Doppler in 2 months to make sure that there is no      extension into the deep veins that might at that time require      anticoagulation.  2. Hypertension:  The patient used to be on medications in the past.      He actually did not have his vitals checked today in the office by      mistake, and I recommend that he gets that addressed upon follow-      up.  The patient is going to follow up with me after his follow-up      ultrasound in 2 months from now.   Thank you for allowing me to participate in the care of your patient.    Sincerely,      Lorine Bears, MD  Electronically Signed    MA/MedQ  DD: 04/28/2010  DT: 04/28/2010  Job #: [id number]

## 2010-09-06 NOTE — Consult Note (Signed)
Joshua [name], Joshua [name]                ACCOUNT NO.:  [id number]   MEDICAL RECORD NO.:  [id number]          PATIENT TYPE:  INP   LOCATION:  6737                         FACILITY:  [facility]   PHYSICIAN:  Georga Hacking, M.D.DATE OF BIRTH:  [birth year]   DATE OF CONSULTATION:  DATE OF DISCHARGE:                                 CONSULTATION   I was asked to see this 40 year old male for evaluation of an arrhythmia  occurring during surgery.  The patient has a previous history of  hypertension, treated with Lisinopril and metoprolol but no previous  cardiac history and no history of arrhythmias.  He took his last dose of  Toprol yesterday morning.  He had a recent back surgery with removal of  hardware and has developed some infection in that area and was in need  of going to the OR this afternoon.  As stated his metoprolol was last  taken yesterday.  On induction of anesthesia he received Fentanyl pre-  induction and went into SVT at a rate of 204 spontaneously.  Blood  pressure remained stable.  He was awake and talking.  He spontaneously  went back to a sinus rhythm in the 100s, then recurred x4.  There was no  response to carotid massage or Valsalva.  There was a paper recorder  malfunction.  They could capture the strip.  He was given esmolol 50 mg  IV and went back into sinus rhythm in the 90s.  Dr. [name] called me over  the phone and we discussed whether or not to continue with surgery or to  abort the surgery.  At the time the patient was stable and had beta-  blockers on board and with the lack of previous cardiac history, I  recommended going ahead with beta-blockade and proceeding with surgery  which evidently was able to be successfully completed.  Since then the  patient has had no recurrence of arrhythmia and has been moved to the  floor and is asymptomatic.   PHYSICAL EXAMINATION:  GENERAL:  He is a stocky male who is currently in  no acute distress.  VITAL SIGNS:  His blood  pressure is currently 132/80, pulse is currently  90 and regular.  SKIN:  Warm and dry.  ENT:  EOMI.  PERRLA.  C&S clear.  Fundi were not examined.  Pharynx  negative.  NECK:  Supple without masses, JVD, thyromegaly, or bruits.  LUNGS:  Clear to A&P.  CARDIAC:  Normal S1 S2.  No S3 or murmur.  There is no edema noted.   EKG was within normal limits.   IMPRESSION:  Supraventricular tachycardia occurring during anesthesia  induction by report from the anesthesiologist, unable to confirm.  This  could be either an underlying arrhythmia that was exacerbated by  anesthesia or possibly worsened by beta-blocker withdrawal since his  last beta-blocker dose was around 36 hours ago.  He has had no  recurrence of arrhythmia and is asymptomatic and feeling fine.   RECOMMENDATIONS:  1. Initiate his beta-blocker  therapy.  2. Monitor him overnight.  3. If he has further surgery  in the future, he should receive      preoperative and postoperative beta-blockers with beta-blockers      given during surgery as needed.   Thank you for asking me to see him with you.      Georga Hacking, M.D.  Electronically Signed     WST/MEDQ  D:  09/03/2006  T:  09/03/2006  Job:  [id number]   cc:   Maxie Better [facility]

## 2010-09-09 NOTE — Consult Note (Signed)
NAME:  Joshua [name], Joshua [name]                          ACCOUNT NO.:  [id number]   MEDICAL RECORD NO.:  [id number]                   PATIENT TYPE:  INP   LOCATION:  3312                                 FACILITY:  [facility]   PHYSICIAN:  Charlies Constable, M.D. [facility]              DATE OF BIRTH:  1970/09/24   DATE OF CONSULTATION:  10/22/2003  DATE OF DISCHARGE:                                   CONSULTATION   REFERRING PHYSICIAN:  Tia Alert, MD   HISTORY OF PRESENT ILLNESS:  Joshua [name] is 40 years old and has no prior  history of known cardiac disease.  He underwent lumbar spine surgery today  for severe degenerative disc disease and spinal stenosis yesterday.  Today,  he developed a sudden onset of rapid tachycardia which was regular with a  narrow QRS at a rate of 198.  He was treated with IV Cardizem and converted  after one dose.  He was aware of symptoms of palpitations, but had no chest  pain or shortness of breath.  He has had no previous history of  palpitations, no chest pain or shortness of breath.   PAST MEDICAL HISTORY:  1. Obstructive sleep apnea.  2. Migraines.  3. Previous lumbar spine surgery.   CURRENT MEDICATIONS:  Robaxin and Percocet.   SOCIAL HISTORY:  He is a truck Marketing executive.  He lives in [place]  with is wife and has three children.  He does not smoke.   FAMILY HISTORY:  Negative for cardiovascular disease.   For details of family history, social history and review of systems, please  see [name]'s complete note.   PHYSICAL EXAMINATION:  VITAL SIGNS:  Blood pressure 120/55, pulse 112 and  regular.  NECK:  There was no venous distention, carotid pulses were full and there  were no bruits.  CHEST:  Clear without rales or rhonchi.  HEART:  Cardiac rhythm was regular.  S1, S2 normal.  No murmurs, rubs or  gallops.  ABDOMEN:  Soft, normal bowel sounds.  No hepatosplenomegaly.  EXTREMITIES:  No edema and pedal pulses were full.  MUSCULOSKELETAL:   No deformities.  SKIN:  Warm and dry.  NEUROLOGIC:  No focal signs.  BACK:  There was a surgical dressing on his back.   LABORATORY DATA AND X-RAY FINDINGS:  His ECG showed an SVT at a rate of 198.  There was some ST depression in the lateral leads.  Followup ECG is pending.   His initial troponin was negative.  His total CK was 1032 and MB 11.4  following lumbar laminectomy.   IMPRESSION:  1. 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  2. Status post lumbar spine surgery.   RECOMMENDATIONS:  1. Will plan to treat the patient with a beta-blocker while he is in the     hospital, but I do not  think he will need treatment after he goes home     since this is a one time occurrence and it occurred in a perioperative     setting.  2. Will get a TSH.  3. Will get a followup ECG and another troponin.  4. If all these are negative, I do not think he will need any further     evaluation.                                               Charlies Constable, M.D. [facility]    BB/MEDQ  D:  10/22/2003  T:  10/22/2003  Job:  [id number]

## 2010-09-09 NOTE — Discharge Summary (Signed)
Joshua [name], Joshua [name]                ACCOUNT NO.:  [id number]   MEDICAL RECORD NO.:  [id number]          PATIENT TYPE:  INP   LOCATION:  6737                         FACILITY:  [facility]   PHYSICIAN:  Tia Alert, MD     DATE OF BIRTH:  March 01, 1971   DATE OF ADMISSION:  09/03/2006  DATE OF DISCHARGE:  09/07/2006                               DISCHARGE SUMMARY   ADMITTING DIAGNOSIS:  Lumbar wound infection.   PROCEDURE:  Irrigation and debridement of lumbar wound.   BRIEF HISTORY OF PRESENT ILLNESS:  Joshua [name] is a 40 year old gentleman  who underwent a lumbar reexploration with removal of hardware.  About 3  weeks prior to this admission he came to the office with a fluid  collection under his wound without significant drainage.  The wound did  not have the appearance of a lumbar wound infection.  This was drained  with a needle and syringe.  He then returned with fluid which had  reaccumulated.  Therefore, we decided to admit him for irrigation and  debridement of the lumbar wound.  At the time of the irrigation and  debridement we found him to have gram-positive cocci in the wound.  He  was on Ancef following the surgery.  He still was somewhat febrile at  102.6 postoperatively.  Infectious disease was consulted and they felt  he should be on 6 weeks of antibiotics.  Therefore, a PICC line was  placed.  He complained of decreasing back soreness.  He had no leg pain  or numbness, tingling or weakness.  The wound grew out Staphylococcus  aureus.  We kept him Ancef.  He had Hemovacs placed which were left in  place.  The Staphylococcus aureus came back sensitive to the Ancef.  Therefore, we decided to put him on 4 weeks of IV Ancef followed by 2  weeks of oral Keflex as per infectious disease recommendations.  He was  discharged home in improved condition on Sep 07, 2006, with plans to  follow up with Dr. [name] and 1 week for a wound check   FINAL DIAGNOSIS:  Lumbar wound infection  status post irrigation and  debridement of lumbar wound.      Tia Alert, MD  Electronically Signed     DSJ/MEDQ  D:  11/16/2006  T:  11/16/2006  Job:  [id number]

## 2010-09-09 NOTE — Op Note (Signed)
NAME:  Joshua [name], Joshua [name]                          ACCOUNT NO.:  [id number]   MEDICAL RECORD NO.:  [id number]                   PATIENT TYPE:  INP   LOCATION:  3172                                 FACILITY:  [facility]   PHYSICIAN:  Tia Alert, MD                  DATE OF BIRTH:  [birth year]   DATE OF PROCEDURE:  10/21/2003  DATE OF DISCHARGE:                                 OPERATIVE REPORT   PREOPERATIVE DIAGNOSES:  1. Lumbar instability, L2-3.  2. Degenerative disk disease, L2-3, L3-4, L4-5.  3. Lumbar spinal stenosis, L2-3, L3-4, L4-5.  4. Previous lumbar fusion at L4-5 with autograft.  5. Back pain and leg pain.   POSTOPERATIVE DIAGNOSES:  1. L2-3 instability.  2. 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  3. Lumbar spinal stenosis, L2-3, L3-4, L4-5.  4. Degenerative disk disease, L2-3, L3-4, L4-5.  5. Back pain and leg pain.   PROCEDURE:  1. Exploration of fusion, L4-5.  2. Lumbar decompressive Gill procedure, L4-5, with decompressive     laminectomy, L2-3 and L3-4, for central canal and nerve root     decompression for lumbar spinal stenosis.  3. Transforaminal lumbar interbody fusion, L2-3, L3-4 and at L4-5, utilizing     10 x 32-mm Peek interbody cages at L2-3 and at L4-5, and a 12 x 32-mm     Peek interbody cage at L3-4; these were all packed with bone-morphogenic     proteins and local autograft.  4. Intertransverse arthrodesis, L2-L5 bilaterally, utilizing bone-     morphogenic proteins and local autograft.  5. Segmental fixation, L2-L5 bilaterally, utilizing the Legacy pedicle screw     system.   SURGEON:  Tia Alert, MD   ASSISTANT:  Donalee Citrin, M.D.   ANESTHESIA:  General endotracheal.   COMPLICATIONS:  None apparent.   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  He had an onset  of  significant back pain with leg pain.  He had an MRI, then a CT myelogram  which showed degenerative disk disease at L2-3, L3-4, L4-5, lumbar spinal  stenosis, L2-3, L3-4 and L4-5.  He had severe stenosis at L4-5 and more  moderate stenosis at L2-3 and L3-4.  He had instability at L2-3 on flexion  and extension radiographs.  I recommended a lumbar decompression and  instrumented fusion from L2-L5.  He understood the risks, benefits and  alternatives and wished to proceed.   DESCRIPTION OF THE PROCEDURE:  The patient was taken to the operating room  and after induction of general endotracheal anesthesia, he was rolled in the  prone position on the Wilson frame and all pressure points were padded.  His  lumbar region was prepped with Duraprep and then  draped in the usual sterile  fashion.  Ten milliliters of local anesthesia were injected and then a  dorsal midline incision was made and carried down through the lumbosacral  fascia.  The old scar tissue was taken down and exposed L2-3, L3-4 and L4-5.  The interspinous ligament at L2-3 was broken and there was obvious  instability at L2-3; he had also not fused at L4-5; there was a  pseudoarthrosis here, as this was quite unstable also intraoperatively.  We  took the dissection out to the transverse processes of L2, L3, L4 and L5,  bilaterally.  Intraoperative fluoroscopy confirmed our level.  Self-  retaining retractors were placed and then using a combination of Leksell  rongeur and Kerrison punches, complete hemi-facetectomies, foraminotomies  and laminectomies were done from L2 to L4, inclusive.  The L2, L3, L4 and L5  nerve roots were identified and followed out into their respective foramina  and decompressed.  The epidural venous vasculature was coagulated.  Once the  decompression was completed, we turned out attention to the TLIF.  We  incised the disk space at L4-5 on the left and used a combination of  curettes and pituitary rongeurs  to perform a complete diskectomy at L4-5.  We then measured the interspace to be 10 mm and then packed the interspace  with BMP and local autograft and then used a 10 x 32-mm Peek interbody cage  and packed this with local autograft and BMP and tapped this into position  at L4-5 from the left.  We then confirmed placement under fluoroscopy.  We  then, on the right at L3-4, incised the disk space and cleaned out the disk  space in the same way, packed it with BMP and local autograft and then used  a 12 x 32-mm Peek interbody cage that was packed with autograft and BMP from  the right side and once again confirmed placement with fluoroscopy and then  on the left side at L2-3, did the same thing; we incised the disk space,  performed a complete diskectomy with curettes and pituitary rongeurs.  We  then packed the interspace with BMP and local autograft and then used a 10 x  32-mm peak interbody cage packed with BMP and autograft and tapped this into  position and then once again confirmed placement with fluoroscopy.  We then  localized the pedicle screw entry zones from L2 to L5.  We probed each  pedicle, tapped each pedicle with a 5.5 tap and placed 6.5 x 45.0-mm pedicle  screws into each pedicle from L2-L5 bilaterally.  We then decorticated the  transverse processes from L2 to L5 and placed a mixture of autograft and BMP  out over these to complete an intertransverse fusion.  We then shaped 2  lordotic rods and placed these into the multiaxial screw heads and locked  these into position with the locking caps and the anti-torque device after  achieving compression at each level.  We then placed 2 separate cross-links  and tightened these into position.  We then check our final screw placement  with AP and lateral fluoroscopy. We then irrigated with copious amounts of  Bacitracin-containing saline solution, dried all bleeding points with bipolar cautery, lined the dura with Gelfoam, placed a  medium Hemovac drain  through a separate stab incision, then closed the muscle and the fascia with  interrupted 0 Vicryl, closed the subcutaneous and subcuticular tissue with 2-  0 and 3-0 Vicryl and closed the skin with  Benzoin and Steri-Strips.  The  drapes were removed, a sterile dressing was applied, the patient was  awakened from general anesthesia and transferred to the recovery room in  stable condition.  At the end of the procedure, all sponge, needle and  instrument counts were correct.                                               Tia Alert, MD    DSJ/MEDQ  D:  10/21/2003  T:  10/21/2003  Job:  [id number]

## 2010-10-18 ENCOUNTER — Encounter: Payer: Self-pay | Admitting: Cardiovascular Disease

## 2010-11-07 ENCOUNTER — Other Ambulatory Visit: Payer: Self-pay | Admitting: Neurological Surgery

## 2010-11-07 DIAGNOSIS — M545 Low back pain, unspecified: Secondary | ICD-10-CM

## 2010-11-07 DIAGNOSIS — M542 Cervicalgia: Secondary | ICD-10-CM

## 2010-11-15 ENCOUNTER — Ambulatory Visit
Admission: RE | Admit: 2010-11-15 | Discharge: 2010-11-15 | Disposition: A | Discharge status: Home / Self Care | Payer: No Typology Code available for payment source | Source: Ambulatory Visit | Attending: Neurological Surgery | Admitting: Neurological Surgery

## 2010-11-15 DIAGNOSIS — M545 Low back pain, unspecified: Secondary | ICD-10-CM

## 2010-11-15 DIAGNOSIS — M542 Cervicalgia: Secondary | ICD-10-CM

## 2010-11-15 MED ORDER — GADOBENATE DIMEGLUMINE 529 MG/ML IV SOLN
20.0000 mL | Freq: Once | INTRAVENOUS | Status: AC | PRN
  Administered 2010-11-15: 20 mL via INTRAVENOUS

## 2010-12-29 IMAGING — RF IR MYELOGRAM [PERSON_NAME]
12 of 20 series · 12 of 20 positions shown · non-contrast
Comparison: MRI lumbar spine 01/10/2010

MYELOGRAM  INJECTION
TECHNIQUE: Informed consent was obtained from the patient prior to
the procedure, including potential complications of headache,
allergy, infection and pain. Specific instructions were given
regarding 24 hour bedrest post procedure to prevent post-LP
headache.     With the patient prone, the lower back was prepped
with Betadine.  1% Lidocaine was used for local anesthesia.  Lumbar
puncture was performed by the radiologist at the L5-S1 level using
a 22 gauge needle with return of clear CSF.  10 ml of 3mnipaque-L00
was injected into the subarachnoid space.
CLINICAL DATA: Relatively minor low back pain.  Mid back pain.
Previous lumbar fusion.  No evidence for myelopathy.  Evaluate
thoracolumbar disc protrusion for degree of neural compression and
possible calcification.
TECHNIQUE: Multidetector CT imaging of the lumbar spine was
performed following myelography.  Multiplanar CT image
reconstructions were also generated.
TECHNIQUE: Multidetector CT imaging of the thoracic spine was

[Series 1: myelogram  white · 1 of 1 slices shown (1 of 12)]
[im 1/1]
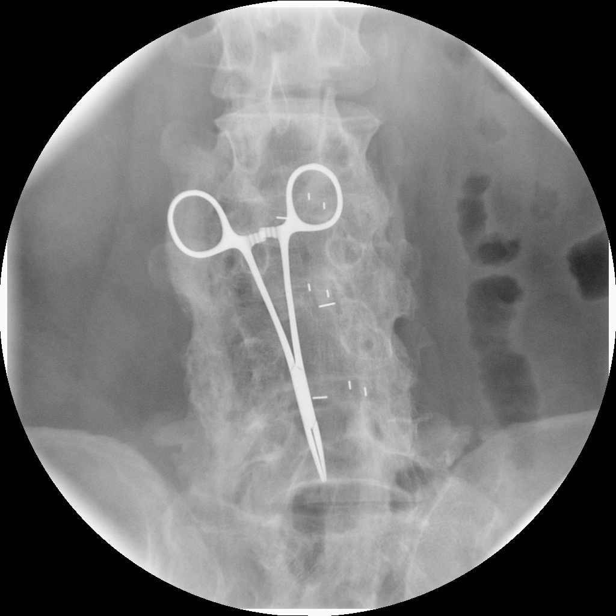

[Series 3: myelogram  white · 1 of 1 slices shown (2 of 12)]
[im 1/1]
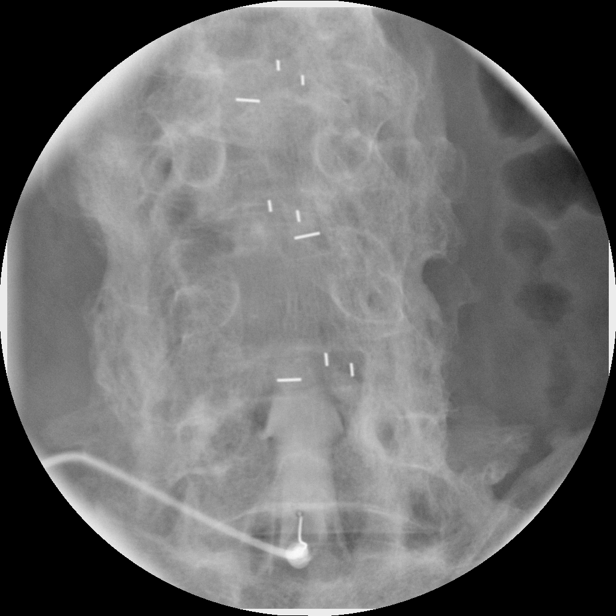

[Series 5: myelogram  white · 1 of 1 slices shown (3 of 12)]
[im 1/1]
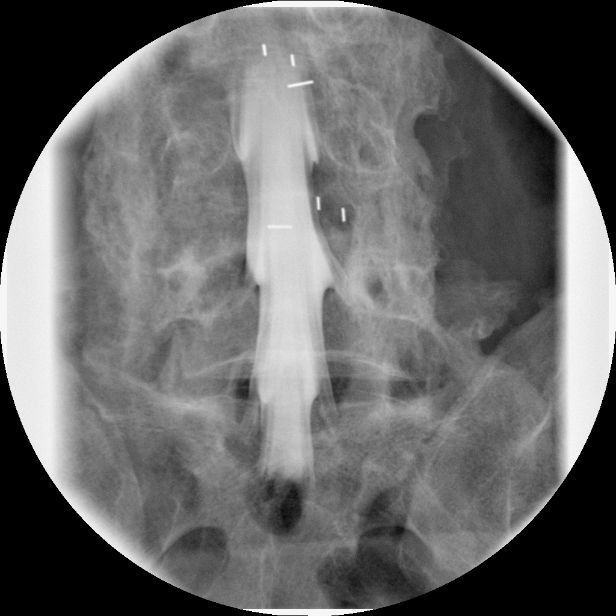

[Series 6: myelogram  white · 1 of 1 slices shown (4 of 12)]
[im 1/1]
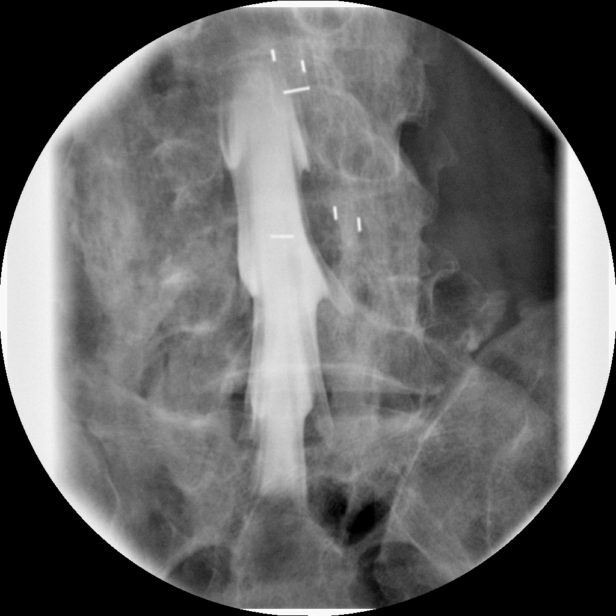

[Series 8: myelogram  white · 1 of 1 slices shown (5 of 12)]
[im 1/1]
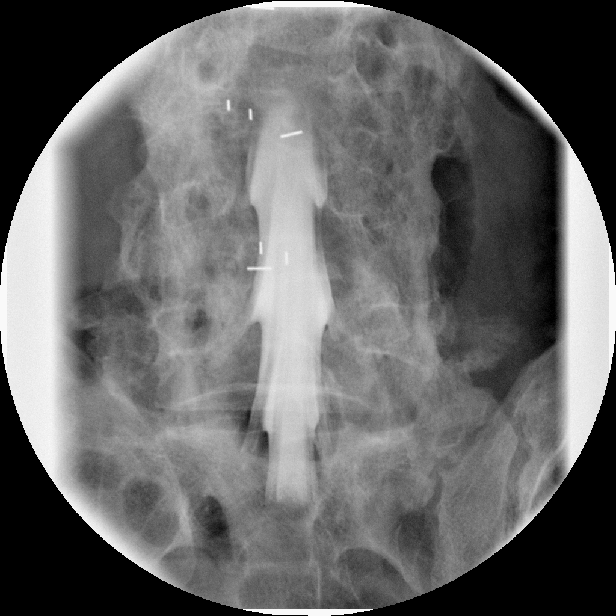

[Series 10: myelogram  white · 1 of 1 slices shown (6 of 12)]
[im 1/1]
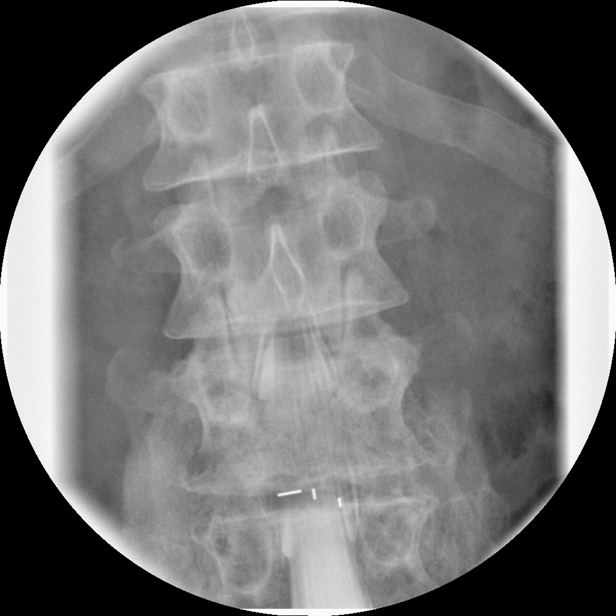

[Series 11: myelogram  white · 1 of 1 slices shown (7 of 12)]
[im 1/1]
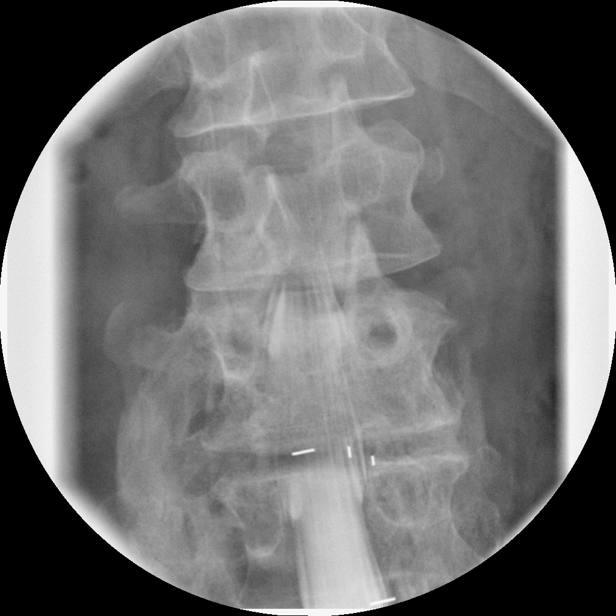

[Series 13: myelogram  white · 1 of 1 slices shown (8 of 12)]
[im 1/1]
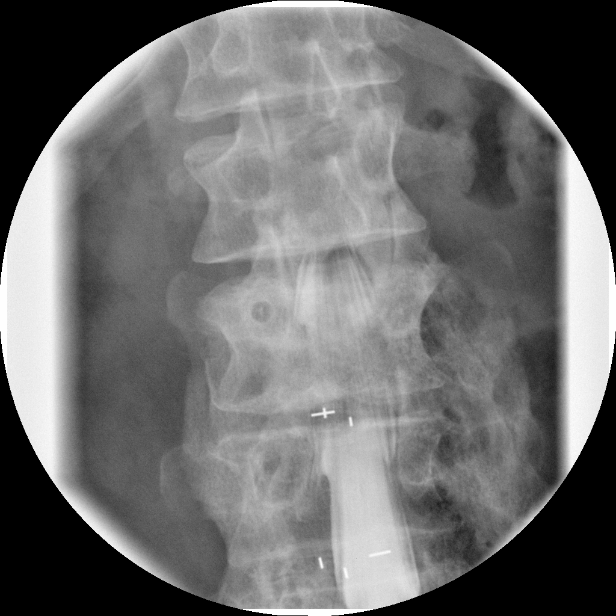

[Series 15: myelogram  white · 1 of 1 slices shown (9 of 12)]
[im 1/1]
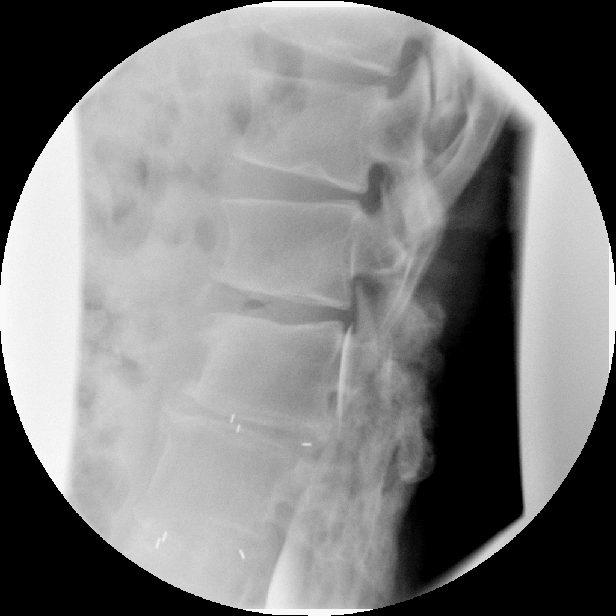

[Series 16: myelogram  white · 1 of 1 slices shown (10 of 12)]
[im 1/1]
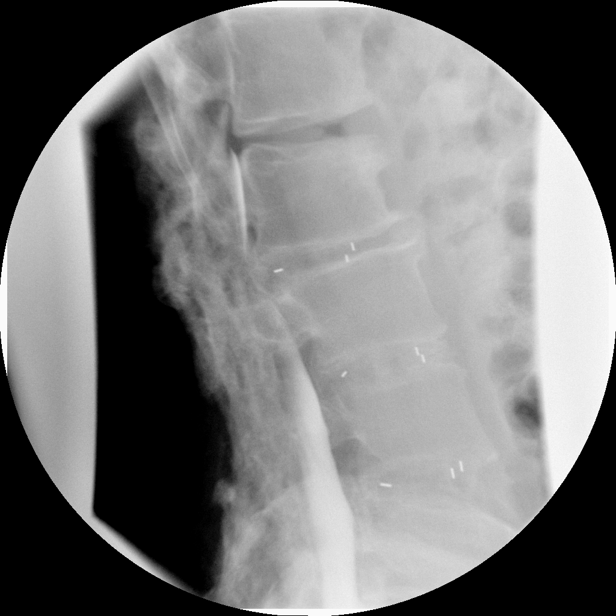

[Series 18: myelogram  white · 1 of 1 slices shown (11 of 12)]
[im 1/1]
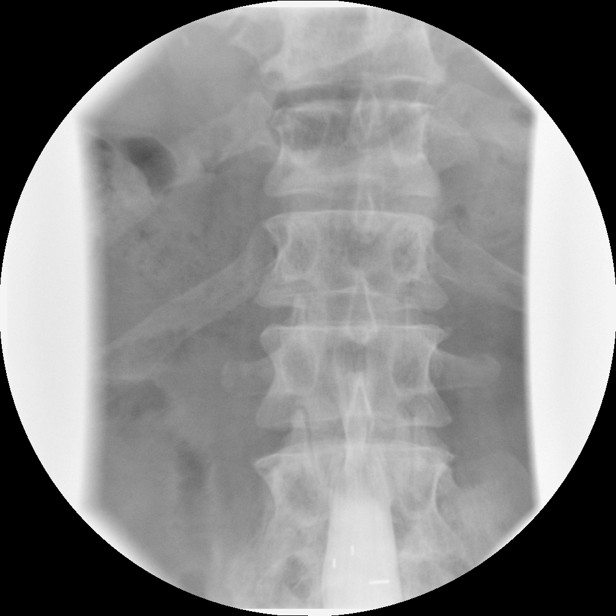

[Series 20: myelogram  white · 1 of 1 slices shown (12 of 12)]
[im 1/1]
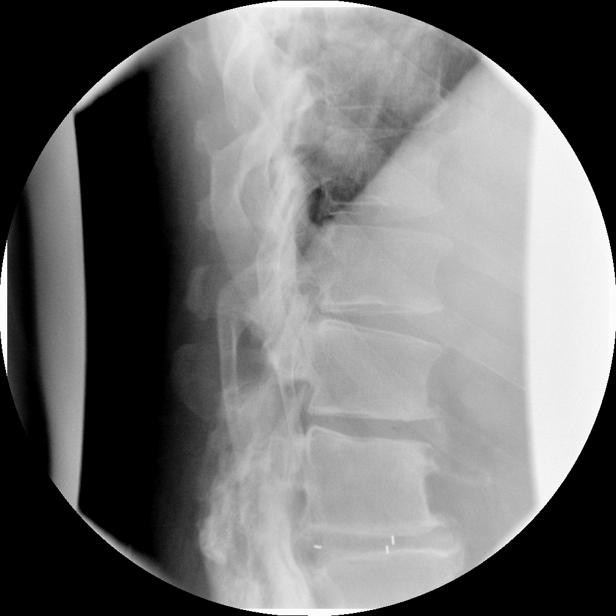

[12 of 20 positions shown; findings below may reference images not displayed]

IMPRESSION: Successful injection of  intrathecal contrast for myelography.

MYELOGRAM LUMBAR
FINDINGS: Good opacification lumbar subarachnoid space.  L2- S1
fusion appears solid.  Moderate stenosis at L1-2 prevents
significant cephalad transport of the dye column.  There is left
greater than right L2 nerve root encroachment at this level.  Also,
at the L2-3 level, there is a broad-based ventral defect which
results in mild stenosis   at this level.  There is mild bilateral
L3 nerve root encroachment myelographically.

Fluoroscopy Time: 2.12 minutes
IMPRESSION: As above

CT MYELOGRAPHY LUMBAR SPINE
FINDINGS: No prevertebral or paraspinous masses.

L1-2: Conus ends mid body L1.  Severe spinal stenosis primarily due
to marked facet overgrowth and to a lesser degree ligamentum flavum
hypertrophy.  Large Schmorl's node projects inferiorly.  There is
mild annular bulging.  Bilateral L2 neural encroachment is present.
Severe foraminal narrowing likely compresses the L1 nerve roots.

L2-3: Solid interbody and posterolateral fusion.  Mild posterior
osseous ridging.  Hardware has been removed.  Mild effacement of
the  ventral thecal sac and left L3 nerve root, but no frank
compression.

L3-4: Solid interbody and posterolateral fusion.  No neural
compression.  Wide laminectomy. Hardware removed.

L4-5: Solid interbody and posterolateral fusion.  No neural
compression.  Wide laminectomy.  Hardware removed.

L5-S1: Moderate facet arthropathy, with probable posterolateral
fusion, but no interbody fusion.

There is good general agreement with prior MRI.
IMPRESSION: Severe adjacent segment stenosis at L1-2 manifest primarily as
facet arthropathy.  Bilateral L2 neural encroachment is observed.

Apparent solid fusion L2-S1.

MYELOGRAM THORACIC
FINDINGS: Incomplete thoracic myelography due to inability to pass
the dye cephalad of L1-2 to any significant degree.   There does
appear to be a moderate ventral defect at T12-L1, described in CT
below.
IMPRESSION: As above.

CT MYELOGRAPHY THORACIC SPINE
There is suboptimal opacification of the thoracic subarachnoid
space due to high-grade block at the upper lumbar lower thoracic
regions.

The individual disc spaces are examined as follows:

 T1-2: Grossly negative.

T2-3:  Grossly negative.

T3-4:  Grossly negative.

T4-5:  Normal.

T5-6:  Minimal posterior osseous spurring.  No compressive lesion.

T6-7:  Schmorl's node projects superiorly into the inferior
endplate of T6.  No posterior disc protrusion.

T7-8:  Minimal Schmorl's node formation above and below.  No
compressive lesion.

T8-9:  Minimal Schmorl's node formation is noted.  No compressive
lesion.

T9-10:  Minimal Schmorl's node formation.

T10-11:    Congenital and acquired stenosis secondary to short
pedicles,   osseous ridging, and   posterior element hypertrophy
affecting facets and ligamentum flava.  Canal diameter 7 mm.  The
cord is displaced to the right and mildly flattened.  It is
possible the exiting nerve roots on either side could be affected.

T11-12:  Mild facet hypertrophy.  No stenosis or disc protrusion.

T12-L1:  Advanced facet arthropathy and ligamentum flavum
hypertrophy.  Central and leftward noncalcified protrusion flattens
the distal conus.  Canal diameter 7 mm.  Compression of either
exiting nerve root is possible, worse on the left.

Compared to the prior MRI, the appearance is fairly similar.
IMPRESSION: Significant stenosis is observed at T10-11 and T12-L1 as described.

The central leftward protrusion at T12-L1, identified on prior MR,
does not appear to be calcified nor is there significant osseous
ridging at this level.

## 2011-01-15 ENCOUNTER — Encounter: Payer: Self-pay | Admitting: Cardiovascular Disease

## 2011-01-17 ENCOUNTER — Ambulatory Visit
Admission: RE | Admit: 2011-01-17 | Discharge: 2011-01-17 | Disposition: A | Discharge status: Home / Self Care | Payer: No Typology Code available for payment source | Source: Ambulatory Visit | Attending: Neurological Surgery | Admitting: Neurological Surgery

## 2011-01-17 ENCOUNTER — Other Ambulatory Visit: Payer: Self-pay | Admitting: Neurological Surgery

## 2011-01-17 DIAGNOSIS — M419 Scoliosis, unspecified: Secondary | ICD-10-CM

## 2011-01-23 LAB — BASIC METABOLIC PANEL
CO2: 27
Chloride: 106
GFR calc Af Amer: >60
Potassium: 4.2
Sodium: 138

## 2011-01-23 LAB — DIFFERENTIAL
Basophils Relative: 1
Eosinophils Absolute: 0.4
Eosinophils Relative: 5
Monocytes Absolute: 0.4
Monocytes Relative: 7
Neutro Abs: 3.5

## 2011-01-23 LAB — CBC
HCT: 39.5
Hemoglobin: 13.5
MCHC: 34.1
MCV: 83.5
RBC: 4.74

## 2011-01-23 LAB — APTT: aPTT: 25

## 2011-01-24 LAB — DIFFERENTIAL
Eosinophils Absolute: 0.5
Eosinophils Relative: 6 — ABNORMAL HIGH
Lymphs Abs: 2.8
Monocytes Relative: 6

## 2011-01-24 LAB — BASIC METABOLIC PANEL
BUN: 8
Chloride: 105
Potassium: 4.4

## 2011-01-24 LAB — CBC
HCT: 42.5
MCV: 83.6
Platelets: 177
WBC: 7.6

## 2011-01-24 LAB — PROTIME-INR: Prothrombin Time: 14.2

## 2011-02-04 IMAGING — CR DG THORACOLUMBAR SPINE 2V
1 series · 1 of 1 positions shown · non-contrast
Comparison: None.

CLINICAL DATA: HNP with spinal stenosis.

THORACOLUMBAR SPINE - 2 VIEW

[view not recorded]
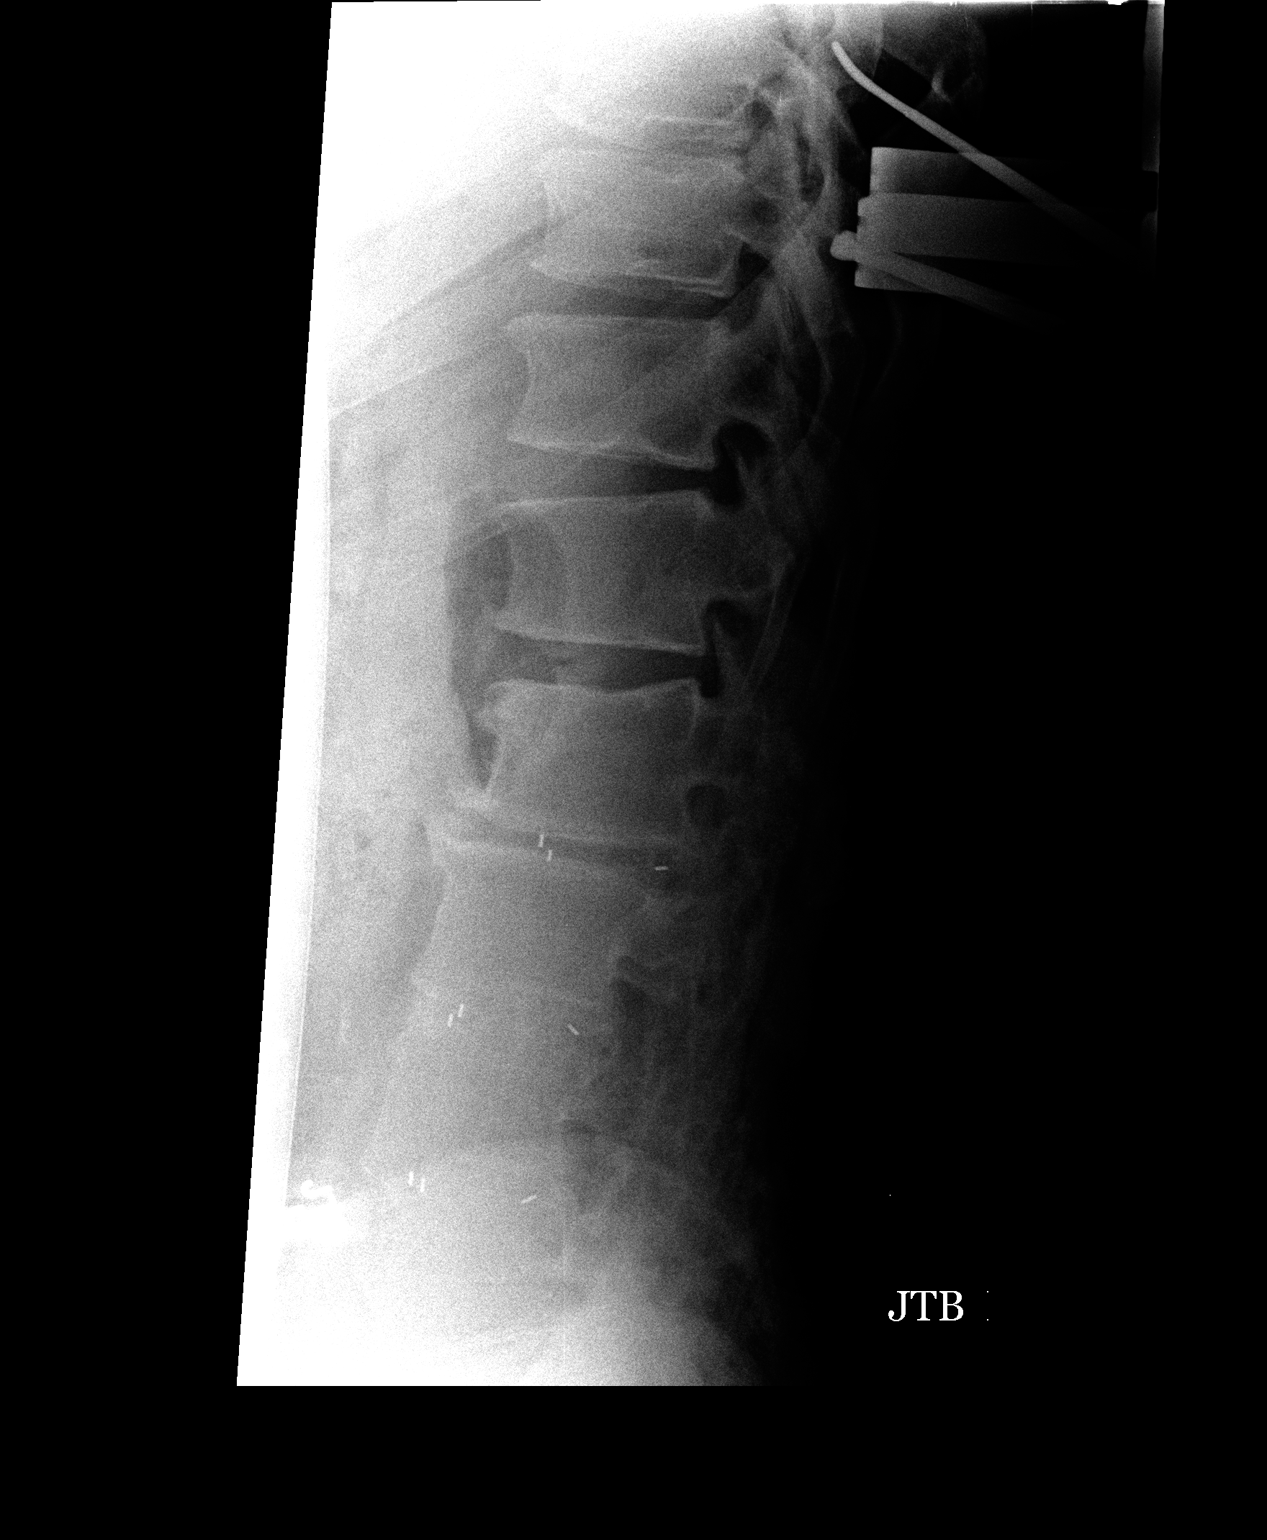

[1 of 1 positions shown; findings below may reference images not displayed]

FINDINGS: Cross-table portable view of the lumbar spine obtained
5993 hours shows the patient be status post previous discectomy at
L2-3, L3-4, and L4-5.  Surgical instrumentation is just visible in
the posterior soft tissues of the back, at the level of L1.

A second cross-table portable film labeled #2 was obtained at [DATE]
hours.  Soft tissue retractors are again seen posterior to the T11
level.  Radiopaque surgical probe is positioned in the surgical bed
with the tip posterior to the T10 level.
IMPRESSION: Intraoperative localization.

## 2012-05-08 ENCOUNTER — Other Ambulatory Visit: Payer: Self-pay | Admitting: Neurological Surgery

## 2012-05-08 DIAGNOSIS — M545 Low back pain, unspecified: Secondary | ICD-10-CM

## 2012-05-08 DIAGNOSIS — M546 Pain in thoracic spine: Secondary | ICD-10-CM

## 2012-05-13 ENCOUNTER — Other Ambulatory Visit: Payer: No Typology Code available for payment source

## 2012-05-17 ENCOUNTER — Other Ambulatory Visit: Payer: No Typology Code available for payment source

## 2012-06-27 ENCOUNTER — Other Ambulatory Visit: Payer: Self-pay | Admitting: Neurological Surgery

## 2012-06-27 DIAGNOSIS — M549 Dorsalgia, unspecified: Secondary | ICD-10-CM

## 2013-10-09 DIAGNOSIS — E785 Hyperlipidemia, unspecified: Secondary | ICD-10-CM | POA: Insufficient documentation

## 2013-11-19 DIAGNOSIS — R200 Anesthesia of skin: Secondary | ICD-10-CM | POA: Insufficient documentation

## 2013-11-19 DIAGNOSIS — R202 Paresthesia of skin: Secondary | ICD-10-CM | POA: Insufficient documentation

## 2013-12-18 DIAGNOSIS — I721 Aneurysm of artery of upper extremity: Secondary | ICD-10-CM | POA: Insufficient documentation

## 2015-06-18 DIAGNOSIS — Z79899 Other long term (current) drug therapy: Secondary | ICD-10-CM | POA: Insufficient documentation

## 2015-06-18 DIAGNOSIS — I119 Hypertensive heart disease without heart failure: Secondary | ICD-10-CM | POA: Insufficient documentation

## 2015-06-18 DIAGNOSIS — F410 Panic disorder [episodic paroxysmal anxiety] without agoraphobia: Secondary | ICD-10-CM | POA: Insufficient documentation

## 2015-06-18 DIAGNOSIS — G4440 Drug-induced headache, not elsewhere classified, not intractable: Secondary | ICD-10-CM | POA: Insufficient documentation

## 2015-06-18 DIAGNOSIS — G894 Chronic pain syndrome: Secondary | ICD-10-CM | POA: Insufficient documentation

## 2016-10-09 ENCOUNTER — Encounter (INDEPENDENT_AMBULATORY_CARE_PROVIDER_SITE_OTHER): Payer: Self-pay | Admitting: Orthopaedic Surgery

## 2016-10-09 ENCOUNTER — Ambulatory Visit (INDEPENDENT_AMBULATORY_CARE_PROVIDER_SITE_OTHER): Payer: Commercial Managed Care - PPO | Admitting: Orthopaedic Surgery

## 2016-10-09 DIAGNOSIS — G8929 Other chronic pain: Secondary | ICD-10-CM | POA: Diagnosis not present

## 2016-10-09 DIAGNOSIS — M25562 Pain in left knee: Secondary | ICD-10-CM | POA: Diagnosis not present

## 2016-10-09 DIAGNOSIS — M1712 Unilateral primary osteoarthritis, left knee: Secondary | ICD-10-CM | POA: Diagnosis not present

## 2016-10-09 NOTE — Progress Notes (Signed)
Office Visit Note   Patient: Joshua [name]           Date of Birth: [birth year]           MRN: [id number] Visit Date: 10/09/2016              Requested by: Lise Auer, MD [address] [place], [place] [address] PCP: Lise Auer, MD   Assessment & Plan: Visit Diagnoses:  1. Chronic pain of left knee   2. Unilateral primary osteoarthritis, left knee     Plan: I do feel that a total knee replacement is warranted at this standpoint point. His pain is daily and is detrimentally affected his activities daily living, his mobility, and his quality of life. He is tried and failed all forms conservative treatment including activity modification, weight loss, quad training exercises, multiple steroid injections as well as anti-inflammatory medications. This treatment is lasted for several years now. We spent a conservative monitor time showing a knee model and going over his x-rays and explained what knee replaced surgery involves including a thorough discussion of risk and benefits of the surgery. He is an accident at this standpoint and wishes to have this set up. I talked about his intraoperative and postoperative course well. We will work on getting this scheduled in the near future. All questions were encouraged and answered.  Follow-Up Instructions: Return for 2 weeks post-op.   Orders:  No orders of the defined types were placed in this encounter.  No orders of the defined types were placed in this encounter.     Procedures: No procedures performed   Clinical Data: No additional findings.   Subjective: No chief complaint on file.  HPI The patient is very pleasant 46 year old gentleman who comes in with a chief complaint of left knee pain and known severe osteophytes and general joint disease of his left knee. Is been hurting for many years now is got worse over 2 years. Injection she is to help his had multiple steroid injections in that knee and multiple years of  conservative treatment. He has x-rays that accompany him today. His pain is mainly on the medial joint line where he points to. The knee does locking catching gives way on him and affects his activities daily living as well as his quality of life. His mobility is limited due to the pain in the pain has not gotten to be 10 out of 10. He says injections used to last for long period time but now do not anymore at all. He has been told he needs a knee replacement like a second opinion about this as well. Review of Systems He denies any headache, chest pain, short of breath, fever, chills, nausea, vomiting.  Objective: Vital Signs: There were no vitals taken for this visit.  Physical Exam He is alert and oriented 3 and in no acute distress Ortho Exam Examination of both his knees show a normal-appearing right knee with normal alignment. His left knee shows varus malalignment. He has significant patellofemoral crepitation is quite severe. He does have a mild effusion. The knee feels ligamentously stable and has good range of motion. Specialty Comments:  No specialty comments available.  Imaging: No results found. X-rays that accompany him and apparently reviewed by me do show a bipartite patella of the left knee as well as complete loss of the medial joint space. There severe osteoarthritis of the medial compartment para-articular osteophytes and sclerotic changes and cystic changes. There significant  varus malalignment as well. There is patellofemoral disease as well.  PMFS History: Patient Active Problem List   Diagnosis Date Noted  . SLEEP APNEA, OBSTRUCTIVE 12/16/2008  . HYPERTENSION 12/16/2008  . BACK PAIN, LUMBAR, CHRONIC 12/16/2008  . DEEP VENOUS THROMBOPHLEBITIS, HX OF 12/16/2008  . MIGRAINES, HX OF 12/16/2008  . Other postprocedural status(V45.89) 02/28/2008   Past Medical History:  Diagnosis Date  . Back problem    Chronic  . DVT of upper extremity (deep vein thrombosis)   .  Headache   . HTN (hypertension)   . Hx of migraines   . Hypertension   . Lumbar back pain    chronic  . Obesity   . Obstructive sleep apnea (adult) (pediatric)   . Personal history of thrombophlebitis     Family History  Problem Relation Age of Onset  . Coronary artery disease Mother        premature    Past Surgical History:  Procedure Laterality Date  . *irrigation and debridement of lumbar wound    . BACK SURGERY  03/25/2010  . BACK SURGERY  1987/2005/2009  . carpal tunnel release, left    . carpal tunnel release, right    . HAND SURGERY  2010  . SHOULDER SURGERY  2010   Social History   Occupational History  . Truck driver [employer]   Social History Main Topics  . Smoking status: Never Smoker  . Smokeless tobacco: Not on file  . Alcohol use No  . Drug use: No  . Sexual activity: Not on file

## 2016-10-20 ENCOUNTER — Encounter (HOSPITAL_COMMUNITY)
Admission: RE | Admit: 2016-10-20 | Discharge: 2016-10-20 | Disposition: A | Discharge status: Home / Self Care | Payer: Commercial Managed Care - PPO | Source: Ambulatory Visit | Attending: Orthopaedic Surgery | Admitting: Orthopaedic Surgery

## 2016-10-20 ENCOUNTER — Encounter (HOSPITAL_COMMUNITY): Payer: Self-pay

## 2016-10-20 DIAGNOSIS — Z8672 Personal history of thrombophlebitis: Secondary | ICD-10-CM | POA: Diagnosis not present

## 2016-10-20 DIAGNOSIS — M545 Low back pain: Secondary | ICD-10-CM | POA: Diagnosis not present

## 2016-10-20 DIAGNOSIS — Z01812 Encounter for preprocedural laboratory examination: Secondary | ICD-10-CM | POA: Diagnosis not present

## 2016-10-20 DIAGNOSIS — Z87898 Personal history of other specified conditions: Secondary | ICD-10-CM | POA: Diagnosis not present

## 2016-10-20 DIAGNOSIS — G8929 Other chronic pain: Secondary | ICD-10-CM | POA: Insufficient documentation

## 2016-10-20 DIAGNOSIS — I10 Essential (primary) hypertension: Secondary | ICD-10-CM | POA: Diagnosis not present

## 2016-10-20 DIAGNOSIS — G4733 Obstructive sleep apnea (adult) (pediatric): Secondary | ICD-10-CM | POA: Diagnosis not present

## 2016-10-20 HISTORY — DX: Unilateral primary osteoarthritis, unspecified knee: M17.10

## 2016-10-20 HISTORY — DX: Osteoarthritis of knee, unspecified: M17.9

## 2016-10-20 HISTORY — DX: Personal history of urinary calculi: Z87.442

## 2016-10-20 LAB — BASIC METABOLIC PANEL
ANION GAP: 7 (ref 5–15)
BUN: 8 mg/dL (ref 6–20)
CALCIUM: 9.2 mg/dL (ref 8.9–10.3)
CO2: 24 mmol/L (ref 22–32)
Chloride: 106 mmol/L (ref 101–111)
Creatinine, Ser: 0.81 mg/dL (ref 0.61–1.24)
GFR calc Af Amer: >60 mL/min (ref >60)
GLUCOSE: 108 mg/dL — AB (ref 65–99)
Potassium: 3.7 mmol/L (ref 3.5–5.1)
SODIUM: 137 mmol/L (ref 135–145)

## 2016-10-20 LAB — CBC
HCT: 41.3 % (ref 39.0–52.0)
HEMOGLOBIN: 13.6 g/dL (ref 13.0–17.0)
MCH: 28.5 pg (ref 26.0–34.0)
MCHC: 32.9 g/dL (ref 30.0–36.0)
MCV: 86.6 fL (ref 78.0–100.0)
Platelets: 204 10*3/uL (ref 150–400)
RBC: 4.77 MIL/uL (ref 4.22–5.81)
RDW: 13.6 % (ref 11.5–15.5)
WBC: 7.9 10*3/uL (ref 4.0–10.5)

## 2016-10-20 LAB — SURGICAL PCR SCREEN
MRSA, PCR: NEGATIVE
STAPHYLOCOCCUS AUREUS: POSITIVE — AB

## 2016-10-20 NOTE — Progress Notes (Signed)
Pt denies SOB, chest pain, and being under the care of a cardiologist.  Pt denies having a stress test and cardiac cath. Pt stated that [facility] and [facility]; records requested. Pt denies having labs within the last several days.

## 2016-10-20 NOTE — Pre-Procedure Instructions (Signed)
    Carloyn JaegerEdmond A Maker  10/20/2016      [facility] - [place] - [address] [place] [id number] Phone: [phone] Fax: [phone]    Your procedure is scheduled on Tuesday, October 31, 2016  [facility] Admitting at 1:00 P.M.  Call this number if you have problems the morning of surgery:  [phone]   Remember:  Do not eat food or drink liquids after midnight Monday, October 30, 2016  Take these medicines the morning of surgery with A SIP OF WATER :None Stop taking Aspirin,vitamins, fish oil and herbal medications. Do not take any NSAIDs ie: Ibuprofen, Motrin, Advil, Naproxen ( Aleve), BC and Goody Powder or any medication containing Aspirin; stop Tuesday, October 24, 2016  Do not wear jewelry, make-up or nail polish.  Do not wear lotions, powders, or perfumes, or deoderant.  Do not shave 48 hours prior to surgery.  Men may shave face and neck.  Do not bring valuables to the hospital.  [facility] is not responsible for any belongings or valuables.  Contacts, dentures or bridgework may not be worn into surgery.  Leave your suitcase in the car.  After surgery it may be brought to your room. For patients admitted to the hospital, discharge time will be determined by your treatment team. Special instructions: Shower the night before surgery and the morning of surgery with CHG. Please read over the following fact sheets that you were given. Pain Booklet, Coughing and Deep Breathing, Total Joint Packet, MRSA Information and Surgical Site Infection Prevention

## 2016-10-23 ENCOUNTER — Other Ambulatory Visit (INDEPENDENT_AMBULATORY_CARE_PROVIDER_SITE_OTHER): Payer: Self-pay | Admitting: Orthopaedic Surgery

## 2016-10-23 DIAGNOSIS — M1712 Unilateral primary osteoarthritis, left knee: Secondary | ICD-10-CM

## 2016-10-27 ENCOUNTER — Other Ambulatory Visit (INDEPENDENT_AMBULATORY_CARE_PROVIDER_SITE_OTHER): Payer: Self-pay | Admitting: Orthopaedic Surgery

## 2016-10-30 MED ORDER — DEXTROSE 5 % IV SOLN
3.0000 g | INTRAVENOUS | Status: AC
  Administered 2016-10-31: 3 g via INTRAVENOUS
  Filled 2016-10-30: qty 3000

## 2016-10-31 ENCOUNTER — Encounter (HOSPITAL_COMMUNITY)
Admission: RE | Disposition: A | Discharge status: Home Health | Payer: Self-pay | Source: Ambulatory Visit | Attending: Orthopaedic Surgery

## 2016-10-31 ENCOUNTER — Inpatient Hospital Stay (HOSPITAL_COMMUNITY): Payer: Commercial Managed Care - PPO | Admitting: Anesthesiology

## 2016-10-31 ENCOUNTER — Inpatient Hospital Stay (HOSPITAL_COMMUNITY)
Admission: RE | Admit: 2016-10-31 | Discharge: 2016-11-03 | DRG: 470 | Disposition: A | Discharge status: Home Health | Payer: Commercial Managed Care - PPO | Source: Ambulatory Visit | Attending: Orthopaedic Surgery | Admitting: Orthopaedic Surgery

## 2016-10-31 ENCOUNTER — Inpatient Hospital Stay (HOSPITAL_COMMUNITY): Payer: Commercial Managed Care - PPO

## 2016-10-31 ENCOUNTER — Encounter (HOSPITAL_COMMUNITY): Payer: Self-pay

## 2016-10-31 DIAGNOSIS — Z86718 Personal history of other venous thrombosis and embolism: Secondary | ICD-10-CM

## 2016-10-31 DIAGNOSIS — Z6834 Body mass index (BMI) 34.0-34.9, adult: Secondary | ICD-10-CM

## 2016-10-31 DIAGNOSIS — Z79899 Other long term (current) drug therapy: Secondary | ICD-10-CM | POA: Diagnosis not present

## 2016-10-31 DIAGNOSIS — Z9119 Patient's noncompliance with other medical treatment and regimen: Secondary | ICD-10-CM

## 2016-10-31 DIAGNOSIS — E669 Obesity, unspecified: Secondary | ICD-10-CM | POA: Diagnosis present

## 2016-10-31 DIAGNOSIS — R110 Nausea: Secondary | ICD-10-CM | POA: Diagnosis not present

## 2016-10-31 DIAGNOSIS — Z8672 Personal history of thrombophlebitis: Secondary | ICD-10-CM | POA: Diagnosis not present

## 2016-10-31 DIAGNOSIS — Z96652 Presence of left artificial knee joint: Secondary | ICD-10-CM

## 2016-10-31 DIAGNOSIS — I10 Essential (primary) hypertension: Secondary | ICD-10-CM | POA: Diagnosis present

## 2016-10-31 DIAGNOSIS — G4733 Obstructive sleep apnea (adult) (pediatric): Secondary | ICD-10-CM | POA: Diagnosis present

## 2016-10-31 DIAGNOSIS — M1712 Unilateral primary osteoarthritis, left knee: Secondary | ICD-10-CM

## 2016-10-31 HISTORY — DX: Headache: R51

## 2016-10-31 HISTORY — DX: Other chronic pain: G89.29

## 2016-10-31 HISTORY — DX: Low back pain, unspecified: M54.50

## 2016-10-31 HISTORY — DX: Obstructive sleep apnea (adult) (pediatric): G47.33

## 2016-10-31 HISTORY — DX: Headache, unspecified: R51.9

## 2016-10-31 HISTORY — DX: Migraine, unspecified, not intractable, without status migrainosus: G43.909

## 2016-10-31 HISTORY — DX: Unspecified osteoarthritis, unspecified site: M19.90

## 2016-10-31 HISTORY — DX: Low back pain: M54.5

## 2016-10-31 HISTORY — PX: TOTAL KNEE ARTHROPLASTY: SHX125

## 2016-10-31 SURGERY — ARTHROPLASTY, KNEE, TOTAL
Anesthesia: Regional | Site: Knee | Laterality: Left

## 2016-10-31 MED ORDER — 0.9 % SODIUM CHLORIDE (POUR BTL) OPTIME
TOPICAL | Status: DC | PRN
  Administered 2016-10-31: 1000 mL

## 2016-10-31 MED ORDER — ALUM & MAG HYDROXIDE-SIMETH 200-200-20 MG/5ML PO SUSP: 30.0000 mL | ORAL | Status: DC | PRN

## 2016-10-31 MED ORDER — DOCUSATE SODIUM 100 MG PO CAPS
100.0000 mg | ORAL_CAPSULE | Freq: Two times a day (BID) | ORAL | Status: DC
  Administered 2016-10-31 – 2016-11-03 (×5): 100 mg via ORAL
  Filled 2016-10-31 (×5): qty 1

## 2016-10-31 MED ORDER — ACETAMINOPHEN 650 MG RE SUPP: 650.0000 mg | Freq: Four times a day (QID) | RECTAL | Status: DC | PRN

## 2016-10-31 MED ORDER — FENTANYL CITRATE (PF) 250 MCG/5ML IJ SOLN
INTRAMUSCULAR | Status: AC
  Filled 2016-10-31: qty 5

## 2016-10-31 MED ORDER — IRBESARTAN 300 MG PO TABS
300.0000 mg | ORAL_TABLET | Freq: Every day | ORAL | Status: DC
  Administered 2016-11-01 – 2016-11-02 (×2): 300 mg via ORAL
  Filled 2016-10-31 (×4): qty 1

## 2016-10-31 MED ORDER — PHENOL 1.4 % MT LIQD: 1.0000 | OROMUCOSAL | Status: DC | PRN

## 2016-10-31 MED ORDER — ONDANSETRON HCL 4 MG/2ML IJ SOLN: 4.0000 mg | Freq: Once | INTRAMUSCULAR | Status: DC | PRN

## 2016-10-31 MED ORDER — FENTANYL CITRATE (PF) 100 MCG/2ML IJ SOLN
INTRAMUSCULAR | Status: AC
  Administered 2016-10-31: 100 ug
  Filled 2016-10-31: qty 2

## 2016-10-31 MED ORDER — ONDANSETRON HCL 4 MG PO TABS: 4.0000 mg | ORAL_TABLET | Freq: Four times a day (QID) | ORAL | Status: DC | PRN

## 2016-10-31 MED ORDER — METOCLOPRAMIDE HCL 5 MG/ML IJ SOLN: 5.0000 mg | Freq: Three times a day (TID) | INTRAMUSCULAR | Status: DC | PRN

## 2016-10-31 MED ORDER — OXYCODONE HCL 5 MG/5ML PO SOLN: 5.0000 mg | Freq: Once | ORAL | Status: DC | PRN

## 2016-10-31 MED ORDER — PROPOFOL 500 MG/50ML IV EMUL
INTRAVENOUS | Status: DC | PRN
  Administered 2016-10-31: 50 ug/kg/min via INTRAVENOUS
  Administered 2016-10-31: 15:00:00 via INTRAVENOUS

## 2016-10-31 MED ORDER — OXYCODONE HCL 5 MG PO TABS
5.0000 mg | ORAL_TABLET | ORAL | Status: DC | PRN
  Administered 2016-10-31: 15 mg via ORAL
  Administered 2016-11-01: 10 mg via ORAL
  Administered 2016-11-01 – 2016-11-02 (×9): 15 mg via ORAL
  Filled 2016-10-31: qty 2
  Filled 2016-10-31: qty 3
  Filled 2016-10-31: qty 2
  Filled 2016-10-31 (×2): qty 3
  Filled 2016-10-31: qty 1
  Filled 2016-10-31 (×4): qty 3
  Filled 2016-10-31: qty 1
  Filled 2016-10-31 (×2): qty 3

## 2016-10-31 MED ORDER — MIDAZOLAM HCL 2 MG/2ML IJ SOLN
INTRAMUSCULAR | Status: AC
  Administered 2016-10-31: 2 mg
  Filled 2016-10-31: qty 2

## 2016-10-31 MED ORDER — HYDROMORPHONE HCL 1 MG/ML IJ SOLN: 0.2500 mg | INTRAMUSCULAR | Status: DC | PRN

## 2016-10-31 MED ORDER — MENTHOL 3 MG MT LOZG: 1.0000 | LOZENGE | OROMUCOSAL | Status: DC | PRN

## 2016-10-31 MED ORDER — RIVAROXABAN 10 MG PO TABS
10.0000 mg | ORAL_TABLET | Freq: Every day | ORAL | Status: DC
  Administered 2016-11-01 – 2016-11-03 (×3): 10 mg via ORAL
  Filled 2016-10-31 (×3): qty 1

## 2016-10-31 MED ORDER — ZOLPIDEM TARTRATE 5 MG PO TABS: 5.0000 mg | ORAL_TABLET | Freq: Every evening | ORAL | Status: DC | PRN

## 2016-10-31 MED ORDER — METOCLOPRAMIDE HCL 5 MG PO TABS: 5.0000 mg | ORAL_TABLET | Freq: Three times a day (TID) | ORAL | Status: DC | PRN

## 2016-10-31 MED ORDER — OXYCODONE HCL 5 MG PO TABS: 5.0000 mg | ORAL_TABLET | Freq: Once | ORAL | Status: DC | PRN

## 2016-10-31 MED ORDER — ACETAMINOPHEN 10 MG/ML IV SOLN
INTRAVENOUS | Status: DC | PRN
  Administered 2016-10-31: 1000 mg via INTRAVENOUS

## 2016-10-31 MED ORDER — SODIUM CHLORIDE 0.9 % IR SOLN
Status: DC | PRN
  Administered 2016-10-31: 3000 mL

## 2016-10-31 MED ORDER — SODIUM CHLORIDE 0.9 % IV SOLN
INTRAVENOUS | Status: DC
  Administered 2016-10-31: 21:00:00 via INTRAVENOUS

## 2016-10-31 MED ORDER — PHENYLEPHRINE HCL 10 MG/ML IJ SOLN
INTRAVENOUS | Status: DC | PRN
  Administered 2016-10-31: 40 ug/min via INTRAVENOUS

## 2016-10-31 MED ORDER — FENTANYL CITRATE (PF) 100 MCG/2ML IJ SOLN
INTRAMUSCULAR | Status: DC | PRN
  Administered 2016-10-31: 50 ug via INTRAVENOUS

## 2016-10-31 MED ORDER — KETOROLAC TROMETHAMINE 15 MG/ML IJ SOLN
7.5000 mg | Freq: Four times a day (QID) | INTRAMUSCULAR | Status: AC
  Administered 2016-10-31 – 2016-11-01 (×4): 7.5 mg via INTRAVENOUS
  Filled 2016-10-31 (×3): qty 1

## 2016-10-31 MED ORDER — CEFAZOLIN SODIUM-DEXTROSE 2-4 GM/100ML-% IV SOLN
2.0000 g | Freq: Four times a day (QID) | INTRAVENOUS | Status: AC
  Administered 2016-10-31 – 2016-11-01 (×2): 2 g via INTRAVENOUS
  Filled 2016-10-31 (×2): qty 100

## 2016-10-31 MED ORDER — ONDANSETRON HCL 4 MG/2ML IJ SOLN
INTRAMUSCULAR | Status: AC
  Filled 2016-10-31: qty 10

## 2016-10-31 MED ORDER — LACTATED RINGERS IV SOLN
INTRAVENOUS | Status: DC
  Administered 2016-10-31: 50 mL/h via INTRAVENOUS
  Administered 2016-10-31: 16:00:00 via INTRAVENOUS

## 2016-10-31 MED ORDER — METHOCARBAMOL 500 MG PO TABS
500.0000 mg | ORAL_TABLET | Freq: Four times a day (QID) | ORAL | Status: DC | PRN
  Administered 2016-10-31 – 2016-11-02 (×4): 500 mg via ORAL
  Filled 2016-10-31 (×5): qty 1

## 2016-10-31 MED ORDER — METHOCARBAMOL 1000 MG/10ML IJ SOLN
500.0000 mg | Freq: Four times a day (QID) | INTRAVENOUS | Status: DC | PRN
  Filled 2016-10-31: qty 5

## 2016-10-31 MED ORDER — ONDANSETRON HCL 4 MG/2ML IJ SOLN
INTRAMUSCULAR | Status: DC | PRN
Start: 2016-10-31 — End: 2016-10-31
  Administered 2016-10-31: 4 mg via INTRAVENOUS

## 2016-10-31 MED ORDER — ONDANSETRON HCL 4 MG/2ML IJ SOLN
4.0000 mg | Freq: Four times a day (QID) | INTRAMUSCULAR | Status: DC | PRN
  Administered 2016-11-02: 4 mg via INTRAVENOUS
  Filled 2016-10-31: qty 2

## 2016-10-31 MED ORDER — ACETAMINOPHEN 325 MG PO TABS
650.0000 mg | ORAL_TABLET | Freq: Four times a day (QID) | ORAL | Status: DC | PRN
  Administered 2016-11-01 – 2016-11-02 (×3): 650 mg via ORAL
  Filled 2016-10-31 (×3): qty 2

## 2016-10-31 MED ORDER — HYDROMORPHONE HCL 1 MG/ML IJ SOLN
1.0000 mg | INTRAMUSCULAR | Status: DC | PRN
  Administered 2016-10-31: 1 mg via INTRAVENOUS
  Filled 2016-10-31: qty 2

## 2016-10-31 MED ORDER — DIPHENHYDRAMINE HCL 12.5 MG/5ML PO ELIX: 12.5000 mg | ORAL_SOLUTION | ORAL | Status: DC | PRN

## 2016-10-31 MED ORDER — PROPOFOL 10 MG/ML IV BOLUS
INTRAVENOUS | Status: DC | PRN
  Administered 2016-10-31: 20 mg via INTRAVENOUS

## 2016-10-31 SURGICAL SUPPLY — 58 items
BANDAGE ACE 6X5 VEL STRL LF (GAUZE/BANDAGES/DRESSINGS) ×2 IMPLANT
BANDAGE ESMARK 6X9 LF (GAUZE/BANDAGES/DRESSINGS) ×1 IMPLANT
BLADE SAG 18X100X1.27 (BLADE) ×2 IMPLANT
BNDG ESMARK 6X9 LF (GAUZE/BANDAGES/DRESSINGS) ×2
BOWL SMART MIX CTS (DISPOSABLE) ×2 IMPLANT
CAPT KNEE TOTAL 3 ×2 IMPLANT
CEMENT BONE SIMPLEX SPEEDSET (Cement) ×2 IMPLANT
COVER SURGICAL LIGHT HANDLE (MISCELLANEOUS) ×2 IMPLANT
CUFF TOURNIQUET SINGLE 34IN LL (TOURNIQUET CUFF) ×2 IMPLANT
CUFF TOURNIQUET SINGLE 44IN (TOURNIQUET CUFF) IMPLANT
DRAPE EXTREMITY T 121X128X90 (DRAPE) ×2 IMPLANT
DRAPE HALF SHEET 40X57 (DRAPES) ×2 IMPLANT
DRAPE U-SHAPE 47X51 STRL (DRAPES) ×2 IMPLANT
DRSG PAD ABDOMINAL 8X10 ST (GAUZE/BANDAGES/DRESSINGS) ×2 IMPLANT
DURAPREP 26ML APPLICATOR (WOUND CARE) ×4 IMPLANT
ELECT CAUTERY BLADE 6.4 (BLADE) ×2 IMPLANT
ELECT REM PT RETURN 9FT ADLT (ELECTROSURGICAL) ×2
ELECTRODE REM PT RTRN 9FT ADLT (ELECTROSURGICAL) ×1 IMPLANT
FACESHIELD WRAPAROUND (MASK) ×4 IMPLANT
GAUZE SPONGE 4X4 12PLY STRL (GAUZE/BANDAGES/DRESSINGS) ×2 IMPLANT
GAUZE XEROFORM 1X8 LF (GAUZE/BANDAGES/DRESSINGS) ×2 IMPLANT
GLOVE BIOGEL PI IND STRL 8 (GLOVE) ×2 IMPLANT
GLOVE BIOGEL PI INDICATOR 8 (GLOVE) ×2
GLOVE ORTHO TXT STRL SZ7.5 (GLOVE) ×2 IMPLANT
GLOVE SURG ORTHO 8.0 STRL STRW (GLOVE) ×2 IMPLANT
GOWN STRL REUS W/ TWL LRG LVL3 (GOWN DISPOSABLE) IMPLANT
GOWN STRL REUS W/ TWL XL LVL3 (GOWN DISPOSABLE) ×2 IMPLANT
GOWN STRL REUS W/TWL LRG LVL3 (GOWN DISPOSABLE)
GOWN STRL REUS W/TWL XL LVL3 (GOWN DISPOSABLE) ×2
HANDPIECE INTERPULSE COAX TIP (DISPOSABLE) ×1
IMMOBILIZER KNEE 22 UNIV (SOFTGOODS) ×2 IMPLANT
KIT BASIN OR (CUSTOM PROCEDURE TRAY) ×2 IMPLANT
KIT ROOM TURNOVER OR (KITS) ×2 IMPLANT
MANIFOLD NEPTUNE II (INSTRUMENTS) ×2 IMPLANT
NDL SAFETY ECLIPSE 18X1.5 (NEEDLE) IMPLANT
NEEDLE HYPO 18GX1.5 SHARP (NEEDLE)
NS IRRIG 1000ML POUR BTL (IV SOLUTION) ×2 IMPLANT
PACK TOTAL JOINT (CUSTOM PROCEDURE TRAY) ×2 IMPLANT
PAD ARMBOARD 7.5X6 YLW CONV (MISCELLANEOUS) ×2 IMPLANT
PADDING CAST COTTON 6X4 STRL (CAST SUPPLIES) ×2 IMPLANT
SET HNDPC FAN SPRY TIP SCT (DISPOSABLE) ×1 IMPLANT
SET PAD KNEE POSITIONER (MISCELLANEOUS) ×2 IMPLANT
STAPLER VISISTAT 35W (STAPLE) ×2 IMPLANT
STRIP CLOSURE SKIN 1/2X4 (GAUZE/BANDAGES/DRESSINGS) IMPLANT
SUCTION FRAZIER HANDLE 10FR (MISCELLANEOUS) ×1
SUCTION TUBE FRAZIER 10FR DISP (MISCELLANEOUS) ×1 IMPLANT
SUT MNCRL AB 4-0 PS2 18 (SUTURE) IMPLANT
SUT VIC AB 0 CT1 27 (SUTURE) ×1
SUT VIC AB 0 CT1 27XBRD ANBCTR (SUTURE) ×1 IMPLANT
SUT VIC AB 1 CT1 27 (SUTURE) ×2
SUT VIC AB 1 CT1 27XBRD ANBCTR (SUTURE) ×2 IMPLANT
SUT VIC AB 2-0 CT1 27 (SUTURE) ×2
SUT VIC AB 2-0 CT1 TAPERPNT 27 (SUTURE) ×2 IMPLANT
SYR 50ML LL SCALE MARK (SYRINGE) IMPLANT
TOWEL OR 17X24 6PK STRL BLUE (TOWEL DISPOSABLE) ×2 IMPLANT
TOWEL OR 17X26 10 PK STRL BLUE (TOWEL DISPOSABLE) ×2 IMPLANT
TRAY CATH 16FR W/PLASTIC CATH (SET/KITS/TRAYS/PACK) IMPLANT
WRAP KNEE MAXI GEL POST OP (GAUZE/BANDAGES/DRESSINGS) ×2 IMPLANT

## 2016-10-31 NOTE — Op Note (Signed)
NAMChristen [name]:  Joshua [name], Joshua [name]                ACCOUNT NO.:  [id number]  MEDICAL RECORD NO.:  [id number]  LOCATION:  [facility]                         FACILITY:  [facility]  PHYSICIAN:  Vanita PandaChristopher Y. Magnus IvanBlackman, M.D.DATE OF BIRTH:  Dec 01, 1970  DATE OF PROCEDURE:  10/31/2016 DATE OF DISCHARGE:                              OPERATIVE REPORT   PREOPERATIVE DIAGNOSIS:  Primary osteoarthritis and degenerative joint disease, left knee.  POSTOPERATIVE DIAGNOSIS:  Primary osteoarthritis and degenerative joint disease, left knee.  PROCEDURE:  Left total knee arthroplasty.  IMPLANT:  Stryker Triathlon knee with size 5 femur, size 3 tibial tray, universal base plate, 11 mm fix-bearing polyethylene insert, size 32 patellar button.  SURGEON:  Vanita PandaChristopher Y. Magnus IvanBlackman, M.D.  ASSISTANT:  Richardean CanalGilbert Michaelson, PA-C.  ANESTHESIA: 1. Left lower extremity adductor canal block. 2. Spinal.  ANTIBIOTICS:  3 g IV Ancef.  BLOOD LOSS:  Less than 200 mL.  TOURNIQUET TIME:  Less than an hour and a half.  COMPLICATIONS:  None.  INDICATIONS:  Joshua [name] is a 46 year old gentleman well known to me.  He has a history of multiple knee arthroscopies done elsewhere.  With that being said, with partial meniscectomy he has had he eventually developed bone-on-bone wear on the medial compartment of his knee.  It is quite evident on his x-rays he has complete loss of medial joint line of his knee.  He does have varus malalignment of the knee and patellofemoral disease as well.  He has tried and failed all forms of conservative treatment at this point.  He does wish to proceed with a total knee arthroplasty.  This is due to his pain being daily.  It has detrimental affected his activities of daily living, his quality of life, and his mobility.  He understands with knee replacement surgery there is risk of acute blood loss anemia, DVT, nerve and vessel injury, fracture, infection, and failure of the implants.  He understands our goals are  to decrease pain, improve mobility, and overall improved quality of life.  PROCEDURE DESCRIPTION:  After informed consent was obtained, appropriate left knee was marked.  Anesthesia was obtained with adductor canal block in the holding area.  He was then brought to the operating room.  Spinal anesthesia was obtained.  He was placed supine on the operating table. A nonsterile tourniquet was placed on his upper left thigh and his left leg was prepped and draped from the thigh down the ankle with DuraPrep and sterile drapes including a sterile stockinette.  Time-out was called to identify correct patient and correct left knee.  I then used an Esmarch to wrap out the leg and tourniquet was inflated to 300 mm of pressure.  We then made a direct midline incision over the patella and carried this proximally and distally.  We dissected down the knee joint and carried out a medial parapatellar arthrotomy.  We found a moderate joint effusion and significant periarticular osteophytes throughout the knee.  We found complete loss of cartilage on the medial aspect of his knee.  With the knee in a flexed position, we removed remnants of ACL, PCL, medial and lateral meniscus.  We then set our extramedullary cutting guide for correcting  the varus and valgus slope taking 9 mm off the high side and correcting for neutral slope as well.  We made the proximal tibia cut without difficulty to the femur then used an intramedullary device through the notch of the femur for setting our distal femoral cut.  We set this for 10 mm cut, setting the rotation at 5 degrees, externally rotated for a left femur.  We made our distal femoral cut without difficulty.  We brought the knee back down in extension with an 11 mm extension block.  We were pleased with the full extension.  We then went back to the femur and put our femoral sizing guide based off the epicondylar axis and chose a size 5 femur.  We put our 4-in-1  cutting block for a size 5 femur, made our anterior and posterior cuts, followed by our chamfer cuts.  We put the knee in 90 degrees of flexion with an 11 mm flexion block.  We also felt that we had achieved our balancing for flexion and extension.  We then made our femoral box cut, went back to the tibia and chose a size 5 tibial tray. We made our keel cut and drilled hole after this to place the universal base plate.  With a trial 5 tibial tray followed by the 5 femur, we trialed 11 mm fix-bearing polyethylene insert.  We were pleased with the range of motion and stability.  We then made our patellar cut and had to avoid a large bipartite patella piece.  We drilled 3 holes for the size 32 patellar button.  We then put the knee through range of motion with all trial components in place.  We then removed all trial components and irrigated the knee with normal saline solution using pulsatile lavage. We then mixed our cement and cemented the real [employer] Triathlon tibial universal base plate size 5 followed by the real 5 femur.  We cleaned cement debris from the knee and then placed our real 11 mm fix-bearing polyethylene insert.  We cemented our patellar button as well.  Once the cement had hardened, we let the tourniquet down and hemostasis was obtained with electrocautery.  We irrigated the knee again with normal saline solution and then closed the arthrotomy with 0 interrupted #1 Vicryl suture followed by 0 Vicryl in the deep tissue, 0 Vicryl in the subcutaneous tissue, 4-0 Monocryl subcuticular stitch, and interrupted staples on the skin.  Xeroform and well-padded sterile dressing were applied.  He was taken to the recovery room after an in-and-out catheter was performed and he was in stable condition.  All final counts were correct.  Of note, Richardean Canal, PA-C assisted in the entire case.  His assistance was crucial throughout the case from the beginning to  the end.     Vanita Panda. Magnus Ivan, M.D.     CYB/MEDQ  D:  10/31/2016  T:  10/31/2016  Job:  [id number]

## 2016-10-31 NOTE — Transfer of Care (Signed)
Immediate Anesthesia Transfer of Care Note  Patient: Joshua [name]  Procedure(s) Performed: Procedure(s): LEFT TOTAL KNEE ARTHROPLASTY (Left)  Patient Location: PACU  Anesthesia Type:MAC, Spinal and MAC combined with regional for post-op pain  Level of Consciousness: awake and alert   Airway & Oxygen Therapy: Patient Spontanous Breathing  Post-op Assessment: Report given to RN and Post -op Vital signs reviewed and stable  Post vital signs: Reviewed and stable  Last Vitals:  Vitals:   10/31/16 1200 10/31/16 1250  BP: 134/85 134/73  Pulse: 87 (!) 115  Resp: 20 (!) 25  Temp: 36.9 C     Last Pain:  Vitals:   10/31/16 1200  PainSc: 0-No pain      Patients Stated Pain Goal: 3 (40/08/67 6195)  Complications: No apparent anesthesia complications

## 2016-10-31 NOTE — Anesthesia Postprocedure Evaluation (Signed)
Anesthesia Post Note  Patient: Joshua JaegerEdmond A [name]  Procedure(s) Performed: Procedure(s) (LRB): LEFT TOTAL KNEE ARTHROPLASTY (Left)     Patient location during evaluation: PACU Anesthesia Type: Regional and Spinal Level of consciousness: awake, awake and alert and oriented Pain management: pain level controlled Vital Signs Assessment: post-procedure vital signs reviewed and stable Respiratory status: spontaneous breathing, nonlabored ventilation and respiratory function stable Cardiovascular status: blood pressure returned to baseline Anesthetic complications: no    Last Vitals:  Vitals:   10/31/16 1655 10/31/16 1700  BP: 118/77   Pulse: 67 88  Resp: 18 15  Temp:      Last Pain:  Vitals:   10/31/16 1700  PainSc: 0-No pain                 Leelyn Jasinski COKER

## 2016-10-31 NOTE — Anesthesia Procedure Notes (Signed)
Spinal  Patient location during procedure: OR Start time: 10/31/2016 1:50 PM End time: 10/31/2016 1:55 PM Staffing Anesthesiologist: Noreene LarssonJOSLIN, Ceceilia Cephus Performed: anesthesiologist  Preanesthetic Checklist Completed: patient identified, site marked, surgical consent, pre-op evaluation, timeout performed, IV checked, risks and benefits discussed and monitors and equipment checked Spinal Block Patient position: sitting Prep: ChloraPrep Patient monitoring: heart rate, cardiac monitor, continuous pulse ox and blood pressure Approach: right paramedian Location: L3-4 Injection technique: single-shot Needle Needle type: Tuohy  Needle gauge: 22 G Needle length: 9 cm Needle insertion depth: 7 cm Assessment Sensory level: T6 Additional Notes 14 mg 0.75% Bupivacaine injected easily

## 2016-10-31 NOTE — H&P (Signed)
TOTAL KNEE ADMISSION H&P  Patient is being admitted for left total knee arthroplasty.  Subjective:  Chief Complaint:left knee pain.  HPI: Joshua [name], 46 y.o. male, has a history of pain and functional disability in the left knee due to arthritis and has failed non-surgical conservative treatments for greater than 12 weeks to includeNSAID's and/or analgesics, corticosteriod injections, viscosupplementation injections, flexibility and strengthening excercises, weight reduction as appropriate and activity modification.  Onset of symptoms was gradual, starting 2 years ago with gradually worsening course since that time. The patient noted no past surgery on the left knee(s).  Patient currently rates pain in the left knee(s) at 10 out of 10 with activity. Patient has night pain, worsening of pain with activity and weight bearing, pain that interferes with activities of daily living, pain with passive range of motion, crepitus and joint swelling.  Patient has evidence of subchondral sclerosis, periarticular osteophytes and joint space narrowing by imaging studies. There is no active infection.  Patient Active Problem List   Diagnosis Date Noted  . Unilateral primary osteoarthritis, left knee 10/31/2016  . SLEEP APNEA, OBSTRUCTIVE 12/16/2008  . HYPERTENSION 12/16/2008  . BACK PAIN, LUMBAR, CHRONIC 12/16/2008  . DEEP VENOUS THROMBOPHLEBITIS, HX OF 12/16/2008  . MIGRAINES, HX OF 12/16/2008  . Other postprocedural status(V45.89) 02/28/2008   Past Medical History:  Diagnosis Date  . Back problem    Chronic  . 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 Headache(784.0)   . History of kidney stones   . HTN (hypertension)   . Hx of migraines   . Hypertension   . Lumbar back pain    chronic  . OA (osteoarthritis) of knee    unilateral primary OA of left knee  . Obesity   . Obstructive sleep apnea (adult) (pediatric)   . Personal history of thrombophlebitis     Past Surgical  History:  Procedure Laterality Date  . *irrigation and debridement of lumbar wound    . BACK SURGERY  03/25/2010  . BACK SURGERY  1987/2005/2009  . carpal tunnel release, left    . carpal tunnel release, right    . HAND SURGERY  2010  . SHOULDER SURGERY  2010    Prescriptions Prior to Admission  Medication Sig Dispense Refill Last Dose  . EDARBI 40 MG TABS Take 1 tablet by mouth daily.    10/30/2016 at Unknown time   Allergies  Allergen Reactions  . No Known Allergies     Social History  Substance Use Topics  . Smoking status: Never Smoker  . Smokeless tobacco: Never Used  . Alcohol use No    Family History  Problem Relation Age of Onset  . Coronary artery disease Mother        premature  . Heart disease Father      Review of Systems  Musculoskeletal: Positive for joint pain.  All other systems reviewed and are negative.   Objective:  Physical Exam  Constitutional: He is oriented to person, place, and time. He appears well-developed and well-nourished.  HENT:  Head: Normocephalic and atraumatic.  Eyes: EOM are normal. Pupils are equal, round, and reactive to light.  Neck: Normal range of motion. Neck supple.  Cardiovascular: Normal rate and regular rhythm.   Respiratory: Effort normal and breath sounds normal.  GI: Soft. Bowel sounds are normal.  Musculoskeletal:       Left knee: He exhibits decreased range of motion, swelling, effusion and abnormal alignment. Tenderness found. Medial joint  line and lateral joint line tenderness noted.  Neurological: He is alert and oriented to person, place, and time.  Skin: Skin is warm and dry.  Psychiatric: He has a normal mood and affect.    Vital signs in last 24 hours:    Labs:   Estimated body mass index is 36.52 kg/m as calculated from the following:   Height as of 10/20/16: 6' (1.829 m).   Weight as of 10/20/16: 269 lb 4.8 oz (122.2 kg).   Imaging Review Plain radiographs demonstrate severe degenerative joint  disease of the left knee(s). The overall alignment ismild varus. The bone quality appears to be excellent for age and reported activity level.  Assessment/Plan:  End stage arthritis, left knee   The patient history, physical examination, clinical judgment of the provider and imaging studies are consistent with end stage degenerative joint disease of the left knee(s) and total knee arthroplasty is deemed medically necessary. The treatment options including medical management, injection therapy arthroscopy and arthroplasty were discussed at length. The risks and benefits of total knee arthroplasty were presented and reviewed. The risks due to aseptic loosening, infection, stiffness, patella tracking problems, thromboembolic complications and other imponderables were discussed. The patient acknowledged the explanation, agreed to proceed with the plan and consent was signed. Patient is being admitted for inpatient treatment for surgery, pain control, PT, OT, prophylactic antibiotics, VTE prophylaxis, progressive ambulation and ADL's and discharge planning. The patient is planning to be discharged home with home health services

## 2016-10-31 NOTE — Anesthesia Preprocedure Evaluation (Signed)
Anesthesia Evaluation  Patient identified by MRN, date of birth, ID band Patient awake    Reviewed: Allergy & Precautions, NPO status , Patient's Chart, lab work & pertinent test results  Airway Mallampati: III  TM Distance: >3 FB Neck ROM: Full    Dental  (+) Teeth Intact, Dental Advisory Given   Pulmonary    breath sounds clear to auscultation       Cardiovascular hypertension,  Rhythm:Regular Rate:Normal     Neuro/Psych    GI/Hepatic   Endo/Other    Renal/GU      Musculoskeletal   Abdominal (+) + obese,   Peds  Hematology   Anesthesia Other Findings   Reproductive/Obstetrics                             Anesthesia Physical Anesthesia Plan  ASA: III  Anesthesia Plan: Regional and Spinal   Post-op Pain Management:    Induction:   PONV Risk Score and Plan: Dexamethasone and Ondansetron  Airway Management Planned: Natural Airway and Simple Face Mask  Additional Equipment:   Intra-op Plan:   Post-operative Plan:   Informed Consent: I have reviewed the patients History and Physical, chart, labs and discussed the procedure including the risks, benefits and alternatives for the proposed anesthesia with the patient or authorized representative who has indicated his/her understanding and acceptance.   Dental advisory given  Plan Discussed with: CRNA and Anesthesiologist  Anesthesia Plan Comments:         Anesthesia Quick Evaluation

## 2016-10-31 NOTE — Brief Op Note (Signed)
10/31/2016  3:35 PM  PATIENT:  Joshua JaegerEdmond A Kozicki  46 y.o. male  PRE-OPERATIVE DIAGNOSIS:  osteoarthritis left knee  POST-OPERATIVE DIAGNOSIS:  osteoarthritis left knee  PROCEDURE:  Procedure(s): LEFT TOTAL KNEE ARTHROPLASTY (Left)  SURGEON:  Surgeon(s) and Role:    Kathryne Hitch* Adhira Jamil Y, MD - Primary  PHYSICIAN ASSISTANT: Rexene EdisonGil Alvillar, PA-C  ANESTHESIA:   regional and spinal  EBL:  Total I/O In: 1000 [I.V.:1000] Out: 100 [Blood:100]  COUNTS:  YES  TOURNIQUET:   Total Tourniquet Time Documented: Thigh (Left) - 72 minutes Total: Thigh (Left) - 72 minutes   DICTATION: .Other Dictation: Dictation Number [id number]  PLAN OF CARE: Admit to inpatient   PATIENT DISPOSITION:  PACU - hemodynamically stable.   Delay start of Pharmacological VTE agent (>24hrs) due to surgical blood loss or risk of bleeding: no

## 2016-10-31 NOTE — Anesthesia Procedure Notes (Addendum)
Anesthesia Regional Block: Adductor canal block   Pre-Anesthetic Checklist: ,, timeout performed, Correct Patient, Correct Site, Correct Laterality, Correct Procedure, Correct Position, site marked, Risks and benefits discussed,  Surgical consent,  Pre-op evaluation,  At surgeon's request and post-op pain management  Laterality: Left  Prep: chloraprep       Needles:  Injection technique: Single-shot  Needle Type: Echogenic Stimulator Needle          Additional Needles:   Procedures: ultrasound guided,,,,,,,,  Narrative:  Start time: 10/31/2016 1:25 PM End time: 10/31/2016 1:30 PM Injection made incrementally with aspirations every 5 mL.  Performed by: Personally  Anesthesiologist: Shaniquia Brafford  Additional Notes: 30 cc 0.5% Bupivacaine with 1:200 Epi injected easily

## 2016-11-01 ENCOUNTER — Encounter (HOSPITAL_COMMUNITY): Payer: Self-pay

## 2016-11-01 LAB — BASIC METABOLIC PANEL
ANION GAP: 6 (ref 5–15)
BUN: 13 mg/dL (ref 6–20)
CHLORIDE: 101 mmol/L (ref 101–111)
CO2: 26 mmol/L (ref 22–32)
Calcium: 8.6 mg/dL — ABNORMAL LOW (ref 8.9–10.3)
Creatinine, Ser: 0.99 mg/dL (ref 0.61–1.24)
GFR calc Af Amer: >60 mL/min (ref >60)
GFR calc non Af Amer: >60 mL/min (ref >60)
GLUCOSE: 134 mg/dL — AB (ref 65–99)
POTASSIUM: 4.4 mmol/L (ref 3.5–5.1)
SODIUM: 133 mmol/L — AB (ref 135–145)

## 2016-11-01 LAB — CBC
HCT: 37.5 % — ABNORMAL LOW (ref 39.0–52.0)
HEMOGLOBIN: 11.7 g/dL — AB (ref 13.0–17.0)
MCH: 28.2 pg (ref 26.0–34.0)
MCHC: 31.2 g/dL (ref 30.0–36.0)
MCV: 90.4 fL (ref 78.0–100.0)
Platelets: 176 10*3/uL (ref 150–400)
RBC: 4.15 MIL/uL — AB (ref 4.22–5.81)
RDW: 14.3 % (ref 11.5–15.5)
WBC: 11 10*3/uL — ABNORMAL HIGH (ref 4.0–10.5)

## 2016-11-01 NOTE — Progress Notes (Signed)
Orthopedic Tech Progress Note Patient Details:  Joshua [name] 10/25/1970 [id number]  Patient ID: Joshua [name], male   DOB: 01/10/1971, 46 y.o.   MRN: [id number] Pt will call if wants to get in cpm  [name], [name] 11/01/2016, 8:11 PM

## 2016-11-01 NOTE — Progress Notes (Signed)
Subjective: 1 Day Post-Op Procedure(s) (LRB): LEFT TOTAL KNEE ARTHROPLASTY (Left) Patient reports pain as moderate.    Objective: Vital signs in last 24 hours: Temp:  [97 F (36.1 C)-98.9 F (37.2 C)] 98.3 F (36.8 C) (07/11 0405) Pulse Rate:  [60-115] 106 (07/11 0405) Resp:  [10-25] 18 (07/11 0405) BP: (109-142)/(65-93) 122/73 (07/11 0405) SpO2:  [96 %-100 %] 96 % (07/11 0405) Weight:  [256 lb (116.1 kg)] 256 lb (116.1 kg) (07/11 0019)  Intake/Output from previous day: 07/10 0701 - 07/11 0700 In: 1596.3 [I.V.:1596.3] Out: 815 [Urine:715; Blood:100] Intake/Output this shift: No intake/output data recorded.   Recent Labs  11/01/16 0609  HGB 11.7*    Recent Labs  11/01/16 0609  WBC 11.0*  RBC 4.15*  HCT 37.5*  PLT 176    Recent Labs  11/01/16 0609  NA 133*  K 4.4  CL 101  CO2 26  BUN 13  CREATININE 0.99  GLUCOSE 134*  CALCIUM 8.6*   No results for input(s): LABPT, INR in the last 72 hours.  Sensation intact distally Intact pulses distally Dorsiflexion/Plantar flexion intact Incision: dressing C/D/I Compartment soft  Assessment/Plan: 1 Day Post-Op Procedure(s) (LRB): LEFT TOTAL KNEE ARTHROPLASTY (Left) Up with therapy Plan for discharge tomorrow Discharge home with home health  Kathryne HitchChristopher Y Amerah Puleo 11/01/2016, 7:26 AM

## 2016-11-01 NOTE — Discharge Instructions (Addendum)
Information on my medicine - XARELTO® (Rivaroxaban) ° °This medication education was reviewed with me or my healthcare representative as part of my discharge preparation.   °Why was Xarelto® prescribed for you? °Xarelto® was prescribed for you to reduce the risk of blood clots forming after orthopedic surgery. The medical term for these abnormal blood clots is venous thromboembolism (VTE). ° °What do you need to know about xarelto® ? °Take your Xarelto® ONCE DAILY at the same time every day. °You may take it either with or without food. ° °If you have difficulty swallowing the tablet whole, you may crush it and mix in applesauce just prior to taking your dose. ° °Take Xarelto® exactly as prescribed by your doctor and DO NOT stop taking Xarelto® without talking to the doctor who prescribed the medication.  Stopping without other VTE prevention medication to take the place of Xarelto® may increase your risk of developing a clot. ° °After discharge, you should have regular check-up appointments with your healthcare provider that is prescribing your Xarelto®.   ° °What do you do if you miss a dose? °If you miss a dose, take it as soon as you remember on the same day then continue your regularly scheduled once daily regimen the next day. Do not take two doses of Xarelto® on the same day.  ° °Important Safety Information °A possible side effect of Xarelto® is bleeding. You should call your healthcare provider right away if you experience any of the following: °? Bleeding from an injury or your nose that does not stop. °? Unusual colored urine (red or dark brown) or unusual colored stools (red or black). °? Unusual bruising for unknown reasons. °? A serious fall or if you hit your head (even if there is no bleeding). ° °Some medicines may interact with Xarelto® and might increase your risk of bleeding while on Xarelto®. To help avoid this, consult your healthcare provider or pharmacist prior to using any new prescription  or non-prescription medications, including herbals, vitamins, non-steroidal anti-inflammatory drugs (NSAIDs) and supplements. ° °This website has more information on Xarelto®: www.xarelto.com. ° °INSTRUCTIONS AFTER JOINT REPLACEMENT  ° °o Remove items at home which could result in a fall. This includes throw rugs or furniture in walking pathways °o ICE to the affected joint every three hours while awake for 30 minutes at a time, for at least the first 3-5 days, and then as needed for pain and swelling.  Continue to use ice for pain and swelling. You may notice swelling that will progress down to the foot and ankle.  This is normal after surgery.  Elevate your leg when you are not up walking on it.   °o Continue to use the breathing machine you got in the hospital (incentive spirometer) which will help keep your temperature down.  It is common for your temperature to cycle up and down following surgery, especially at night when you are not up moving around and exerting yourself.  The breathing machine keeps your lungs expanded and your temperature down. ° ° °DIET:  As you were doing prior to hospitalization, we recommend a well-balanced diet. ° °DRESSING / WOUND CARE / SHOWERING ° °Keep the surgical dressing until follow up.  The dressing is water proof, so you can shower without any extra covering.  IF THE DRESSING FALLS OFF or the wound gets wet inside, change the dressing with sterile gauze.  Please use good hand washing techniques before changing the dressing.  Do not use any lotions   or creams on the incision until instructed by your surgeon.   ° °ACTIVITY ° °o Increase activity slowly as tolerated, but follow the weight bearing instructions below.   °o No driving for 6 weeks or until further direction given by your physician.  You cannot drive while taking narcotics.  °o No lifting or carrying greater than 10 lbs. until further directed by your surgeon. °o Avoid periods of inactivity such as sitting longer than  an hour when not asleep. This helps prevent blood clots.  °o You may return to work once you are authorized by your doctor.  ° ° ° °WEIGHT BEARING  ° °Weight bearing as tolerated with assist device (walker, cane, etc) as directed, use it as long as suggested by your surgeon or therapist, typically at least 4-6 weeks. ° ° °EXERCISES ° °Results after joint replacement surgery are often greatly improved when you follow the exercise, range of motion and muscle strengthening exercises prescribed by your doctor. Safety measures are also important to protect the joint from further injury. Any time any of these exercises cause you to have increased pain or swelling, decrease what you are doing until you are comfortable again and then slowly increase them. If you have problems or questions, call your caregiver or physical therapist for advice.  ° °Rehabilitation is important following a joint replacement. After just a few days of immobilization, the muscles of the leg can become weakened and shrink (atrophy).  These exercises are designed to build up the tone and strength of the thigh and leg muscles and to improve motion. Often times heat used for twenty to thirty minutes before working out will loosen up your tissues and help with improving the range of motion but do not use heat for the first two weeks following surgery (sometimes heat can increase post-operative swelling).  ° °These exercises can be done on a training (exercise) mat, on the floor, on a table or on a bed. Use whatever works the best and is most comfortable for you.    Use music or television while you are exercising so that the exercises are a pleasant break in your day. This will make your life better with the exercises acting as a break in your routine that you can look forward to.   Perform all exercises about fifteen times, three times per day or as directed.  You should exercise both the operative leg and the other leg as well. ° °Exercises  include: °  °• Quad Sets - Tighten up the muscle on the front of the thigh (Quad) and hold for 5-10 seconds.   °• Straight Leg Raises - With your knee straight (if you were given a brace, keep it on), lift the leg to 60 degrees, hold for 3 seconds, and slowly lower the leg.  Perform this exercise against resistance later as your leg gets stronger.  °• Leg Slides: Lying on your back, slowly slide your foot toward your buttocks, bending your knee up off the floor (only go as far as is comfortable). Then slowly slide your foot back down until your leg is flat on the floor again.  °• Angel Wings: Lying on your back spread your legs to the side as far apart as you can without causing discomfort.  °• Hamstring Strength:  Lying on your back, push your heel against the floor with your leg straight by tightening up the muscles of your buttocks.  Repeat, but this time bend your knee to a comfortable angle,   and push your heel against the floor.  You may put a pillow under the heel to make it more comfortable if necessary.  ° °A rehabilitation program following joint replacement surgery can speed recovery and prevent re-injury in the future due to weakened muscles. Contact your doctor or a physical therapist for more information on knee rehabilitation.  ° ° °CONSTIPATION ° °Constipation is defined medically as fewer than three stools per week and severe constipation as less than one stool per week.  Even if you have a regular bowel pattern at home, your normal regimen is likely to be disrupted due to multiple reasons following surgery.  Combination of anesthesia, postoperative narcotics, change in appetite and fluid intake all can affect your bowels.  ° °YOU MUST use at least one of the following options; they are listed in order of increasing strength to get the job done.  They are all available over the counter, and you may need to use some, POSSIBLY even all of these options:   ° °Drink plenty of fluids (prune juice may be  helpful) and high fiber foods °Colace 100 mg by mouth twice a day  °Senokot for constipation as directed and as needed Dulcolax (bisacodyl), take with full glass of water  °Miralax (polyethylene glycol) once or twice a day as needed. ° °If you have tried all these things and are unable to have a bowel movement in the first 3-4 days after surgery call either your surgeon or your primary doctor.   ° °If you experience loose stools or diarrhea, hold the medications until you stool forms back up.  If your symptoms do not get better within 1 week or if they get worse, check with your doctor.  If you experience "the worst abdominal pain ever" or develop nausea or vomiting, please contact the office immediately for further recommendations for treatment. ° ° °ITCHING:  If you experience itching with your medications, try taking only a single pain pill, or even half a pain pill at a time.  You can also use Benadryl over the counter for itching or also to help with sleep.  ° °TED HOSE STOCKINGS:  Use stockings on both legs until for at least 2 weeks or as directed by physician office. They may be removed at night for sleeping. ° °MEDICATIONS:  See your medication summary on the “After Visit Summary” that nursing will review with you.  You may have some home medications which will be placed on hold until you complete the course of blood thinner medication.  It is important for you to complete the blood thinner medication as prescribed. ° °PRECAUTIONS:  If you experience chest pain or shortness of breath - call 911 immediately for transfer to the hospital emergency department.  ° °If you develop a fever greater that 101 F, purulent drainage from wound, increased redness or drainage from wound, foul odor from the wound/dressing, or calf pain - CONTACT YOUR SURGEON.   °                                                °FOLLOW-UP APPOINTMENTS:  If you do not already have a post-op appointment, please call the office for an  appointment to be seen by your surgeon.  Guidelines for how soon to be seen are listed in your “After Visit Summary”, but are typically between 1-4   weeks after surgery. ° °OTHER INSTRUCTIONS:  ° °Knee Replacement:  Do not place pillow under knee, focus on keeping the knee straight while resting. CPM instructions: 0-90 degrees, 2 hours in the morning, 2 hours in the afternoon, and 2 hours in the evening. Place foam block, curve side up under heel at all times except when in CPM or when walking.  DO NOT modify, tear, cut, or change the foam block in any way. ° °MAKE SURE YOU:  °• Understand these instructions.  °• Get help right away if you are not doing well or get worse.  ° ° °Thank you for letting us be a part of your medical care team.  It is a privilege we respect greatly.  We hope these instructions will help you stay on track for a fast and full recovery!  ° °

## 2016-11-01 NOTE — Evaluation (Signed)
Physical Therapy Evaluation Patient Details Name: Joshua [name] MRN: [id number] DOB: 11/20/1970 Today's Date: 11/01/2016   History of Present Illness  Patient is a 46 y/o male admitted for L TKA.  PMH positive for HTN, OSA, back surgery, kidney stones, shoulder surgery, UE DVT and carpal tunnel surgery.  Clinical Impression  Patient presents with decreased independence with mobility due to deficits listed in PT problem list.  He will benefit from skilled PT in the acute setting to allow return home with family support and follow up HHPT.  Patient fatigued with hallway ambulation this session with HR 154, SpO2 96% on RA.  Will continue to monitor.     Follow Up Recommendations DC plan and follow up therapy as arranged by surgeon;Home health PT    Equipment Recommendations  3in1 (PT)    Recommendations for Other Services       Precautions / Restrictions Precautions Precautions: Fall;Knee Required Braces or Orthoses: Knee Immobilizer - Left Knee Immobilizer - Left: On when out of bed or walking Restrictions LLE Weight Bearing: Weight bearing as tolerated      Mobility  Bed Mobility Overal bed mobility: Needs Assistance Bed Mobility: Supine to Sit     Supine to sit: HOB elevated;Min guard     General bed mobility comments: to steady  Transfers Overall transfer level: Needs assistance Equipment used: Rolling walker (2 wheeled) Transfers: Sit to/from Stand Sit to Stand: Min guard         General transfer comment: for balance, cues for hand placement  Ambulation/Gait Ambulation/Gait assistance: Min guard Ambulation Distance (Feet): 170 Feet Assistive device: Rolling walker (2 wheeled) Gait Pattern/deviations: Step-to pattern;Trunk flexed;Decreased stride length;Antalgic     General Gait Details: cues for proximity to walker, for posture and stride length  Stairs            Wheelchair Mobility    Modified Rankin (Stroke Patients Only)       Balance  Overall balance assessment: Needs assistance Sitting-balance support: No upper extremity supported;Feet supported Sitting balance-Leahy Scale: Good     Standing balance support: No upper extremity supported Standing balance-Leahy Scale: Fair Standing balance comment: can balance without UE support, but needs A for ambulation                             Pertinent Vitals/Pain Pain Assessment: 0-10 Pain Score: 5  Pain Location: L knee Pain Descriptors / Indicators: Aching;Grimacing Pain Intervention(s): Monitored during session;Repositioned;Ice applied    Home Living Family/patient expects to be discharged to:: Private residence Living Arrangements: Spouse/significant other Available Help at Discharge: Family Type of Home: House Home Access: Stairs to enter Entrance Stairs-Rails: Right Entrance Stairs-Number of Steps: 3 Home Layout: One level Home Equipment: Environmental consultantWalker - 2 wheels      Prior Function Level of Independence: Independent               Hand Dominance        Extremity/Trunk Assessment   Upper Extremity Assessment Upper Extremity Assessment: Overall WFL for tasks assessed    Lower Extremity Assessment Lower Extremity Assessment: LLE deficits/detail LLE Deficits / Details: AAROM knee extension 10, flexion 78       Communication   Communication: No difficulties  Cognition Arousal/Alertness: Awake/alert Behavior During Therapy: WFL for tasks assessed/performed Overall Cognitive Status: Within Functional Limits for tasks assessed  General Comments General comments (skin integrity, edema, etc.): c/o fatigue, HR 154 with ambulation, SpO2 96%    Exercises Total Joint Exercises Heel Slides: AAROM;Left;Seated;10 reps Long Arc Quad: AAROM;Left;10 reps;Seated   Assessment/Plan    PT Assessment Patient needs continued PT services  PT Problem List Decreased strength;Decreased range of  motion;Decreased mobility;Decreased activity tolerance;Decreased balance;Decreased knowledge of use of DME;Pain       PT Treatment Interventions DME instruction;Gait training;Stair training;Balance training;Functional mobility training;Therapeutic exercise;Patient/family education;Therapeutic activities    PT Goals (Current goals can be found in the Care Plan section)  Acute Rehab PT Goals Patient Stated Goal: To return to independent PT Goal Formulation: With patient Time For Goal Achievement: 11/04/16 Potential to Achieve Goals: Good    Frequency 7X/week   Barriers to discharge        Co-evaluation               AM-PAC PT "6 Clicks" Daily Activity  Outcome Measure Difficulty turning over in bed (including adjusting bedclothes, sheets and blankets)?: None Difficulty moving from lying on back to sitting on the side of the bed? : Total Difficulty sitting down on and standing up from a chair with arms (e.g., wheelchair, bedside commode, etc,.)?: Total Help needed moving to and from a bed to chair (including a wheelchair)?: A Little Help needed walking in hospital room?: A Little Help needed climbing 3-5 steps with a railing? : A Little 6 Click Score: 15    End of Session Equipment Utilized During Treatment: Gait belt;Left knee immobilizer   Patient left: with call bell/phone within reach;in chair   PT Visit Diagnosis: Difficulty in walking, not elsewhere classified (R26.2);Pain Pain - Right/Left: Left Pain - part of body: Knee    Time: 1610-9604 PT Time Calculation (min) (ACUTE ONLY): 32 min   Charges:   PT Evaluation $PT Eval Moderate Complexity: 1 Procedure PT Treatments $Gait Training: 8-22 mins   PT G CodesSheran Lawless, [place] [phone] 11/01/2016   Elray Mcgregor 11/01/2016, 10:39 AM

## 2016-11-01 NOTE — Progress Notes (Signed)
Physical Therapy Treatment Patient Details Name: Joshua [name] MRN: [id number] DOB: 06/01/1970 Today's Date: 11/01/2016    History of Present Illness Patient is a 46 y/o male admitted for L TKA.  PMH positive for HTN, OSA, back surgery, kidney stones, shoulder surgery, UE DVT and carpal tunnel surgery.    PT Comments    Patient progressing this session with improved tolerance to ambulation and activity.  Still somewhat self limited with distance and did not attempt stairs yet, but feel should progress to be ready for d/c home tomorrow with family support and follow up HHPT.    Follow Up Recommendations  Home health PT;DC plan and follow up therapy as arranged by surgeon     Equipment Recommendations  3in1 (PT)    Recommendations for Other Services       Precautions / Restrictions Precautions Precautions: Fall;Knee Required Braces or Orthoses: Knee Immobilizer - Left Restrictions Weight Bearing Restrictions: Yes LLE Weight Bearing: Weight bearing as tolerated    Mobility  Bed Mobility Overal bed mobility: Needs Assistance Bed Mobility: Sit to Supine       Sit to supine: Supervision   General bed mobility comments: cues for positioning  Transfers Overall transfer level: Needs assistance Equipment used: Rolling walker (2 wheeled) Transfers: Sit to/from Stand Sit to Stand: Supervision         General transfer comment: cues for hand placement  Ambulation/Gait Ambulation/Gait assistance: Supervision Ambulation Distance (Feet): 200 Feet Assistive device: Rolling walker (2 wheeled) Gait Pattern/deviations: Step-through pattern;Decreased stride length;Antalgic     General Gait Details: cues for posture   Stairs            Wheelchair Mobility    Modified Rankin (Stroke Patients Only)       Balance Overall balance assessment: Needs assistance   Sitting balance-Leahy Scale: Good     Standing balance support: No upper extremity supported Standing  balance-Leahy Scale: Fair                              Cognition Arousal/Alertness: Awake/alert Behavior During Therapy: WFL for tasks assessed/performed Overall Cognitive Status: Within Functional Limits for tasks assessed                                        Exercises Total Joint Exercises Quad Sets: AROM;5 reps;Supine;Left Short Arc Quad: AROM;Left;10 reps;Supine Heel Slides: 5 reps;Seated;Left;AAROM Hip ABduction/ADduction: AROM;Left;10 reps;Supine Straight Leg Raises: AROM;10 reps;Supine;Left Goniometric ROM: approx 10-70 seated ROM    General Comments        Pertinent Vitals/Pain Pain Score: 7  Pain Location: L knee Pain Descriptors / Indicators: Aching;Grimacing Pain Intervention(s): Monitored during session;RN gave pain meds during session    Home Living                      Prior Function            PT Goals (current goals can now be found in the care plan section) Progress towards PT goals: Progressing toward goals    Frequency    7X/week      PT Plan      Co-evaluation              AM-PAC PT "6 Clicks" Daily Activity  Outcome Measure  Difficulty turning over in bed (including adjusting bedclothes, sheets and blankets)?:  None Difficulty moving from lying on back to sitting on the side of the bed? : A Little Difficulty sitting down on and standing up from a chair with arms (e.g., wheelchair, bedside commode, etc,.)?: A Little Help needed moving to and from a bed to chair (including a wheelchair)?: A Little Help needed walking in hospital room?: A Little Help needed climbing 3-5 steps with a railing? : A Little 6 Click Score: 19    End of Session     Patient left: in bed;with call bell/phone within reach;in CPM   PT Visit Diagnosis: Difficulty in walking, not elsewhere classified (R26.2);Pain Pain - Right/Left: Left Pain - part of body: Knee     Time: 1425-1451 PT Time Calculation (min)  (ACUTE ONLY): 26 min  Charges:  $Gait Training: 8-22 mins $Therapeutic Exercise: 8-22 mins                    G CodesSheran [name], Joshua [name] [phone] 11/01/2016    Joshua [name] 11/01/2016, 5:22 PM

## 2016-11-02 MED ORDER — METHOCARBAMOL 500 MG PO TABS: 500.0000 mg | ORAL_TABLET | Freq: Four times a day (QID) | ORAL | 1 refills | Status: DC | PRN

## 2016-11-02 MED ORDER — RIVAROXABAN 10 MG PO TABS: 10.0000 mg | ORAL_TABLET | Freq: Every day | ORAL | 0 refills | Status: DC

## 2016-11-02 MED ORDER — OXYCODONE-ACETAMINOPHEN 5-325 MG PO TABS: 1.0000 | ORAL_TABLET | ORAL | 0 refills | Status: DC | PRN

## 2016-11-02 NOTE — Care Management Note (Signed)
Case Management Note  Patient Details  Name: Joshua [name] MRN: [id number] Date of Birth: 12/13/1970  Subjective/Objective:   46 yr old gentleman s/p left total knee arthroplasty.               Action/Plan: Case manager spoke with patient concerning discharge plan and DME needs. Choice was offered for Home Health agency. [facility] and a therapist by the name of Leandrew KoyanagiRyan Beckham, CM [facility] and gave [facility]. Patient has rolling walker and says he will borrow a 3in1.  He will have family support at discharge.    Expected Discharge Date:    11/02/16              Expected Discharge Plan:  Home w Home Health Services  In-House Referral:  NA  Discharge planning Services  CM Consult  Post Acute Care Choice:  Home Health Choice offered to:  Patient  DME Arranged:  N/A DME Agency:  NA (Has RW will borrow 3in1)  HH Arranged:  PT HH [facility]  Status of Service:  Completed, signed off  If discussed at Long Length of Stay Meetings, dates discussed:    Additional Comments:  Durenda GuthrieBrady, [place], RN 11/02/2016, 12:01 PM

## 2016-11-02 NOTE — Progress Notes (Signed)
Physical Therapy Treatment Patient Details Name: Joshua [name] MRN: [id number] DOB: 09/13/1970 Today's Date: 11/02/2016    History of Present Illness Patient is a 46 y/o male admitted for L TKA.  PMH positive for HTN, OSA, back surgery, kidney stones, shoulder surgery, UE DVT and carpal tunnel surgery.    PT Comments    Patient progressing to practice stair negotiation and simulated car transfer this session.  Feel continued work on ROM and gait during remainder of acute stay indicated prior to d/c home.    Follow Up Recommendations  Home health PT;DC plan and follow up therapy as arranged by surgeon     Equipment Recommendations  3in1 (PT)    Recommendations for Other Services       Precautions / Restrictions Precautions Precautions: Fall;Knee Required Braces or Orthoses: Knee Immobilizer - Left Knee Immobilizer - Left: On when out of bed or walking Restrictions LLE Weight Bearing: Weight bearing as tolerated    Mobility  Bed Mobility         Supine to sit: Modified independent (Device/Increase time)        Transfers Overall transfer level: Needs assistance Equipment used: Rolling walker (2 wheeled) Transfers: Sit to/from Stand Sit to Stand: Supervision         General transfer comment: cues for hand placement; practiced car transfers  Ambulation/Gait Ambulation/Gait assistance: Supervision Ambulation Distance (Feet): 220 Feet Assistive device: Rolling walker (2 wheeled) Gait Pattern/deviations: Step-through pattern;Decreased stride length;Antalgic     General Gait Details: cues for posture   Stairs Stairs: Yes   Stair Management: Two rails;Step to pattern;Forwards Number of Stairs: 2 General stair comments: wife present, educated on how to assist and manage walker  Wheelchair Mobility    Modified Rankin (Stroke Patients Only)       Balance Overall balance assessment: Needs assistance Sitting-balance support: No upper extremity  supported;Feet supported Sitting balance-Leahy Scale: Good     Standing balance support: No upper extremity supported Standing balance-Leahy Scale: Fair                              Cognition Arousal/Alertness: Awake/alert Behavior During Therapy: WFL for tasks assessed/performed Overall Cognitive Status: Within Functional Limits for tasks assessed                                        Exercises Total Joint Exercises Ankle Circles/Pumps: AROM;Both;10 reps;Supine Quad Sets: AROM;Both;10 reps;Supine Short Arc Quad: AAROM;AROM;Left;10 reps;Supine Heel Slides: AAROM;10 reps;Left;Supine Hip ABduction/ADduction: AROM;Left;10 reps;Supine Straight Leg Raises: AAROM;AROM;Supine;Left;10 reps Goniometric ROM: 8-60    General Comments        Pertinent Vitals/Pain Pain Assessment: Faces Faces Pain Scale: Hurts even more Pain Location: L knee Pain Descriptors / Indicators: Aching;Grimacing Pain Intervention(s): Monitored during session;RN gave pain meds during session    Home Living                      Prior Function            PT Goals (current goals can now be found in the care plan section) Progress towards PT goals: Progressing toward goals    Frequency    7X/week      PT Plan      Co-evaluation              AM-PAC PT "6  Clicks" Daily Activity  Outcome Measure  Difficulty turning over in bed (including adjusting bedclothes, sheets and blankets)?: None Difficulty moving from lying on back to sitting on the side of the bed? : None Difficulty sitting down on and standing up from a chair with arms (e.g., wheelchair, bedside commode, etc,.)?: A Little Help needed moving to and from a bed to chair (including a wheelchair)?: A Little Help needed walking in hospital room?: A Little Help needed climbing 3-5 steps with a railing? : A Little 6 Click Score: 20    End of Session Equipment Utilized During Treatment: Gait  belt;Left knee immobilizer   Patient left: with call bell/phone within reach;in chair;with family/visitor present   PT Visit Diagnosis: Difficulty in walking, not elsewhere classified (R26.2);Pain Pain - Right/Left: Right Pain - part of body: Knee     Time: 9562-1308 PT Time Calculation (min) (ACUTE ONLY): 26 min  Charges:  $Gait Training: 8-22 mins $Therapeutic Exercise: 8-22 mins                    G CodesSheran [name], [name] [phone] 11/02/2016    Joshua [name] 11/02/2016, 1:41 PM

## 2016-11-02 NOTE — Progress Notes (Addendum)
Subjective: 2 Days Post-Op Procedure(s) (LRB): LEFT TOTAL KNEE ARTHROPLASTY (Left) Patient reports pain as moderate.  No chest pain or SOB. Some nausea this AM.   Objective: Vital signs in last 24 hours: Temp:  [98.3 F (36.8 C)-100.9 F (38.3 C)] 98.6 F (37 C) (07/12 0536) Pulse Rate:  [104-119] 106 (07/12 0536) Resp:  [18] 18 (07/12 0536) BP: (106-132)/(55-73) 113/73 (07/12 0536) SpO2:  [97 %-99 %] 97 % (07/12 0536)  Intake/Output from previous day: 07/11 0701 - 07/12 0700 In: -  Out: 990 [Urine:990] Intake/Output this shift: No intake/output data recorded.   Recent Labs  11/01/16 0609  HGB 11.7*    Recent Labs  11/01/16 0609  WBC 11.0*  RBC 4.15*  HCT 37.5*  PLT 176    Recent Labs  11/01/16 0609  NA 133*  K 4.4  CL 101  CO2 26  BUN 13  CREATININE 0.99  GLUCOSE 134*  CALCIUM 8.6*   No results for input(s): LABPT, INR in the last 72 hours.  Left leg: Sensation intact distally Intact pulses distally Incision: dressing C/D/I Compartment soft  Assessment/Plan: 2 Days Post-Op Procedure(s) (LRB): LEFT TOTAL KNEE ARTHROPLASTY (Left) Up with therapy  Will plan on discharge to home tomorrow. Patient has no one at home until tomorrow.   Joshua [name] 11/02/2016, 8:54 AM

## 2016-11-02 NOTE — Progress Notes (Signed)
Physical Therapy Treatment Patient Details Name: Joshua Joshua [name] [place] MRN: [id number] DOB: 05/21/1970 Today's Date: 11/02/2016    History of Present Illness Patient is Joshua [name] 46 y/o male admitted for L TKA.  PMH positive for HTN, OSA, back surgery, kidney stones, shoulder surgery, UE DVT and carpal tunnel surgery.    PT Comments    Pt performed exceptionally well during tx today. Pt continues to progress towards goals and increasing LLE strength, endurance, and ROM. Pt also responds well to VC's during gait sequence; RLE step length.    Follow Up Recommendations  Home health PT;DC plan and follow up therapy as arranged by surgeon     Equipment Recommendations  3in1 (PT)    Recommendations for Other Services       Precautions / Restrictions Precautions Precautions: Fall;Knee Precaution Booklet Issued: No Required Braces or Orthoses: Knee Immobilizer - Left Knee Immobilizer - Left: On when out of bed or walking Restrictions Weight Bearing Restrictions: Yes LLE Weight Bearing: Weight bearing as tolerated    Mobility  Bed Mobility Overal bed mobility: Needs Assistance Bed Mobility: Supine to Sit;Sit to Supine     Supine to sit: Modified independent (Device/Increase time) Sit to supine: Supervision   General bed mobility comments: VC's for L foot position.   Transfers Overall transfer level: Needs assistance Equipment used: Rolling walker (2 wheeled) Transfers: Sit to/from Stand Sit to Stand: Supervision         General transfer comment: VC's for hand placement.  Ambulation/Gait Ambulation/Gait assistance: Supervision Ambulation Distance (Feet): 220 Feet Assistive device: Rolling walker (2 wheeled) Gait Pattern/deviations: Step-through pattern;Decreased stride length;Antalgic;Decreased step length - right Gait velocity: Increased.   General Gait Details: Cues for RLE step length and to decrease gait velocity. x2 intermittent standing rest breaks for ~10sec.     Stairs  Wheelchair Mobility    Modified Rankin (Stroke Patients Only)       Balance Overall balance assessment: Needs assistance Sitting-balance support: No upper extremity supported;Feet supported Sitting balance-Leahy Scale: Good     Standing balance support: No upper extremity supported Standing balance-Leahy Scale: Good Standing balance comment: can balance without UE support, but needs assistance for ambulation.                            Cognition Arousal/Alertness: Awake/alert Behavior During Therapy: WFL for tasks assessed/performed Overall Cognitive Status: Within Functional Limits for tasks assessed                                        Exercises Total Joint Exercises Ankle Circles/Pumps: AROM;Both;20 reps;Supine Quad Sets: AROM;Left;10 reps;Supine Towel Squeeze: AROM;Both;10 reps;Supine Short Arc Quad: AROM;Left;10 reps;Supine Heel Slides: AROM;Left;10 reps;Supine Hip ABduction/ADduction: AROM;Left;10 reps;Supine Straight Leg Raises: AROM;Left;10 reps;Supine Goniometric ROM: L knee at 85 deg, EOB sitting.     General Comments        Pertinent Vitals/Pain Pain Assessment: 0-10 Pain Score: 8  (Zero at rest.) Pain Location: L knee Pain Descriptors / Indicators: Tightness Pain Intervention(s): Monitored during session;Ice applied;RN gave pain meds during session    Home Living                      Prior Function            PT Goals (current goals can now be found in the care plan section) Acute Rehab  PT Goals Patient Stated Goal: To return to independent PT Goal Formulation: With patient Potential to Achieve Goals: Good Progress towards PT goals: Progressing toward goals    Frequency    7X/week      PT Plan      Co-evaluation              AM-PAC PT "6 Clicks" Daily Activity  Outcome Measure  Difficulty turning over in bed (including adjusting bedclothes, sheets and blankets)?:  None Difficulty moving from lying on back to sitting on the side of the bed? : None Difficulty sitting down on and standing up from Joshua [name] chair with arms (e.g., wheelchair, bedside commode, etc,.)?: Joshua [name] Little Help needed moving to and from Joshua [name] bed to chair (including Joshua [name] wheelchair)?: Joshua [name] Little Help needed walking in hospital room?: None Help needed climbing 3-5 steps with Joshua [name] railing? : Joshua [name] Little 6 Click Score: 21    End of Session Equipment Utilized During Treatment: Gait belt Activity Tolerance: Patient tolerated treatment well Patient left: in bed;with call bell/phone within reach   PT Visit Diagnosis: Difficulty in walking, not elsewhere classified (R26.2);Pain Pain - Right/Left: Right Pain - part of body: Knee     Time: 0311-0350 PT Time Calculation (min) (ACUTE ONLY): 39 min  Charges:  $Gait Training: 23-37 mins $Therapeutic Exercise: 8-22 mins                    G CodesFerman Hamming, [place] [phone]   Ferman Hamming 11/02/2016, 5:32 PM

## 2016-11-03 NOTE — Progress Notes (Signed)
Pt ready for d/c home today per MD. Discharge instructions and prescriptions reviewed with pt and his son, all questions answered. Pt has equipment already at home, and was set with home health by CM. PIV removed, and belongings including ice packs sent with pt.   Joshua [name], Latricia HeftKorie G

## 2016-11-03 NOTE — Discharge Summary (Signed)
Patient ID: Joshua [name] MRN: [id number] DOB/AGE: 48/11/1970 48 y.o.  Admit date: 10/31/2016 Discharge date: 11/03/2016  Admission Diagnoses:  Principal Problem:   Unilateral primary osteoarthritis, left knee Active Problems:   Status post total knee replacement, left   Discharge Diagnoses:  Same  Past Medical History:  Diagnosis Date  . Arthritis    "wrists, knees, back, shoulders; everywhere" (11/01/2016)  . Chronic lower back pain   . Daily headache    "recently; related to pain in my left knee" (11/01/2016)  . 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 History of kidney stones   . HTN (hypertension)   . Hypertension   . Migraine    "might get 1/month now" (11/01/2016)  . OA (osteoarthritis) of knee    unilateral primary OA of left knee  . Obesity   . OSA (obstructive sleep apnea)    "don't wear the mask" (11/01/2016)  . Personal history of thrombophlebitis     Surgeries: Procedure(s): LEFT TOTAL KNEE ARTHROPLASTY on 10/31/2016   Consultants:   Discharged Condition: Improved  Hospital Course: Joshua [name] is an 46 y.o. male who was admitted 10/31/2016 for operative treatment ofUnilateral primary osteoarthritis, left knee. Patient has severe unremitting pain that affects sleep, daily activities, and work/hobbies. After pre-op clearance the patient was taken to the operating room on 10/31/2016 and underwent  Procedure(s): LEFT TOTAL KNEE ARTHROPLASTY.    Patient was given perioperative antibiotics: Anti-infectives    Start     Dose/Rate Route Frequency Ordered Stop   10/31/16 2045  ceFAZolin (ANCEF) IVPB 2g/100 mL premix     2 g 200 mL/hr over 30 Minutes Intravenous Every 6 hours 10/31/16 2030 11/01/16 0243   10/31/16 1300  ceFAZolin (ANCEF) 3 g in dextrose 5 % 50 mL IVPB     3 g 130 mL/hr over 30 Minutes Intravenous To [facility]Stay [facility] 10/30/16 1419 10/31/16 1401       Patient was given sequential compression devices, early ambulation, and  chemoprophylaxis to prevent DVT.  Patient benefited maximally from hospital stay and there were no complications.    Recent vital signs: Patient Vitals for the past 24 hrs:  BP Temp Temp src Pulse Resp SpO2  11/03/16 0542 126/68 100.3 F (37.9 C) Oral (!) 107 19 99 %  11/02/16 2041 112/69 99.8 F (37.7 C) Oral (!) 122 20 95 %  11/02/16 1500 (!) 102/48 98.4 F (36.9 C) Oral 98 19 99 %  11/02/16 1100 140/89 98.2 F (36.8 C) Oral (!) 123 20 98 %     Recent laboratory studies:  Recent Labs  11/01/16 0609  WBC 11.0*  HGB 11.7*  HCT 37.5*  PLT 176  NA 133*  K 4.4  CL 101  CO2 26  BUN 13  CREATININE 0.99  GLUCOSE 134*  CALCIUM 8.6*     Discharge Medications:   Allergies as of 11/03/2016      Reactions   No Known Allergies       Medication List    TAKE these medications   EDARBI 40 MG Tabs Generic drug:  Azilsartan Medoxomil Take 1 tablet by mouth daily.   methocarbamol 500 MG tablet Commonly known as:  ROBAXIN Take 1 tablet (500 mg total) by mouth every 6 (six) hours as needed for muscle spasms.   oxyCODONE-acetaminophen 5-325 MG tablet Commonly known as:  ROXICET Take 1-2 tablets by mouth every 4 (four) hours as needed for severe pain.   rivaroxaban 10 MG Tabs  tablet Commonly known as:  XARELTO Take 1 tablet (10 mg total) by mouth daily.            Durable Medical Equipment        Start     Ordered   10/31/16 2031  DME 3 n 1  Once     10/31/16 2030   10/31/16 2031  DME Walker rolling  Once    Question:  Patient needs a walker to treat with the following condition  Answer:  Status post total knee replacement, left   10/31/16 2030      Diagnostic Studies: Dg Knee Left Port  Result Date: 10/31/2016 CLINICAL DATA:  Status post total left knee replacement. EXAM: PORTABLE LEFT KNEE - 1-2 VIEW COMPARISON:  Preoperative radiographs 07/29/2015 FINDINGS: Left knee total arthroplasty in expected alignment. No periprosthetic lucency or fracture. There is  been patellar resurfacing. Recent postsurgical change includes air and edema in the joint and soft tissues. There are anterior skin staples. IMPRESSION: Left knee arthroplasty without immediate postoperative complication. Electronically Signed   By: Rubye Oaks M.D.   On: 10/31/2016 18:17    Disposition: to home  Discharge Instructions    Discharge patient    Complete by:  As directed    Discharge disposition:  01-Home or Self Care   Discharge patient date:  11/03/2016      Follow-up Information    Kathryne Hitch, MD. Schedule an appointment as soon as possible for a visit in 2 week(s).   Specialty:  Orthopedic Surgery Contact information: [address] [place] [address] [phone]        [facility] Follow up.   Specialty:  Home Health Services Why:  A representative from [facility] will contact you to arrange start date and time for your therapy. Contact information: [address] [place] [address] [phone]            Signed: Kathryne Hitch 11/03/2016, 6:59 AM

## 2016-11-03 NOTE — Progress Notes (Signed)
Patient ID: Carloyn JaegerEdmond A [name], male   DOB: 04/19/1971, 46 y.o.   MRN: [id number] Doing well. Can be discharged to home today.

## 2016-11-06 ENCOUNTER — Telehealth (INDEPENDENT_AMBULATORY_CARE_PROVIDER_SITE_OTHER): Payer: Self-pay

## 2016-11-06 NOTE — Telephone Encounter (Signed)
Ryan with HHPT would like verbal orders to see patient  5 x week for 1 week and  1 x next week and would modify after patient is seen on F/U visit with Dr. Magnus IvanBlackman.  Cb# is [phone].    Gave verbal orders for patient

## 2016-11-14 ENCOUNTER — Encounter (INDEPENDENT_AMBULATORY_CARE_PROVIDER_SITE_OTHER): Payer: Self-pay | Admitting: Orthopaedic Surgery

## 2016-11-14 ENCOUNTER — Ambulatory Visit (INDEPENDENT_AMBULATORY_CARE_PROVIDER_SITE_OTHER): Payer: Commercial Managed Care - PPO | Admitting: Orthopaedic Surgery

## 2016-11-14 VITALS — Ht 72.0 in | Wt 256.0 lb

## 2016-11-14 DIAGNOSIS — Z96652 Presence of left artificial knee joint: Secondary | ICD-10-CM

## 2016-11-14 NOTE — Progress Notes (Signed)
The patient is now 2 weeks status post a left total knee arthroplasty. He is minimal swelling he states and he is doing well overall. He was on a blood thinning medication Xarelto which she is done with. He denies any calf pain. He is ready to transition outpatient therapy. [place].  On examination of his left knee there is still moderate effusion. He has full extension to about 90 of flexion. His calf is soft. The knee feels ligamentously stable. The incision line has intact staples and no evidence infection.  We'll remove the staples today and placed Steri-Strips. I gave him a prescription to [facility] in physical therapy about setting up outpatient therapy. All questions were encouraged and answered. He said he did not need medications today either. He knows that he can call before he sees a back in 4 weeks and get more if he needs them. He is cleared to drive since this is his left knee is not taking a lot of pain medication. He understands not to take a narcotic when he drives.

## 2016-11-20 ENCOUNTER — Telehealth (INDEPENDENT_AMBULATORY_CARE_PROVIDER_SITE_OTHER): Payer: Self-pay | Admitting: Orthopaedic Surgery

## 2016-11-20 NOTE — Telephone Encounter (Signed)
I think that he is already had home therapy. In less his insurance approves them coming out more he will just have to work on exercises on his own and getting his knee moving as much as possible on his own.

## 2016-11-20 NOTE — Telephone Encounter (Signed)
Patient called saying that the PT with his insurance is going to be 75-80 dollars a session. He was wondering if he could get in home therapy or do it on his own since he's not currently working and can't afford it. CB # [phone]

## 2016-11-20 NOTE — Telephone Encounter (Signed)
See below

## 2016-11-22 NOTE — Telephone Encounter (Signed)
Needs note faxed to Wayne SeverJennifer Fracheur fax [phone] OOW note after surgery

## 2016-11-23 ENCOUNTER — Encounter (INDEPENDENT_AMBULATORY_CARE_PROVIDER_SITE_OTHER): Payer: Self-pay

## 2016-11-23 NOTE — Telephone Encounter (Signed)
Faxed to provided number  

## 2016-12-12 ENCOUNTER — Encounter (INDEPENDENT_AMBULATORY_CARE_PROVIDER_SITE_OTHER): Payer: Self-pay | Admitting: Orthopaedic Surgery

## 2016-12-12 ENCOUNTER — Ambulatory Visit (INDEPENDENT_AMBULATORY_CARE_PROVIDER_SITE_OTHER): Payer: Commercial Managed Care - PPO | Admitting: Physician Assistant

## 2016-12-12 DIAGNOSIS — Z96652 Presence of left artificial knee joint: Secondary | ICD-10-CM

## 2016-12-12 MED ORDER — METHOCARBAMOL 500 MG PO TABS: 500.0000 mg | ORAL_TABLET | Freq: Four times a day (QID) | ORAL | 1 refills | Status: DC | PRN

## 2016-12-12 MED ORDER — OXYCODONE-ACETAMINOPHEN 5-325 MG PO TABS: 1.0000 | ORAL_TABLET | ORAL | 0 refills | Status: DC | PRN

## 2016-12-12 NOTE — Progress Notes (Signed)
[name] follows up today for his left total knee. He is now 42 days postop. States he is doing well in regards to the knee. He is doing physical therapy with the therapist seemed is personally through his home. He does request a refill on his muscle relaxant and his pain medicine. Heis started having some back pain again. He is patient of Dr. Yetta [name] is undergone a total of 10 surgeries on his back. He's had no new injury. Denies any shortness breath, chest pain or calf pain.  Physical exam: Left knee has full extension flexion 105. No instability valgus varus stressing. Surgical incisions well-healed. Calf supple nontender. Ambulates without any assistive device.  Impression: Status post left total knee arthroplasty  Acute on chronic back pain  Plan: Continue to work on range of motion strengthening of the left knee. Discussed with him and gave him a handout on back exercises. He will follow up with [place] in 1 month check his progress lack of. Refill on his Percocet and muscle relaxants given today. Questions encouraged and answered length.

## 2017-01-08 ENCOUNTER — Ambulatory Visit (INDEPENDENT_AMBULATORY_CARE_PROVIDER_SITE_OTHER): Payer: Commercial Managed Care - PPO | Admitting: Orthopaedic Surgery

## 2017-01-11 ENCOUNTER — Ambulatory Visit (INDEPENDENT_AMBULATORY_CARE_PROVIDER_SITE_OTHER): Payer: Commercial Managed Care - PPO | Admitting: Orthopaedic Surgery

## 2017-01-11 DIAGNOSIS — Z96652 Presence of left artificial knee joint: Secondary | ICD-10-CM

## 2017-01-11 MED ORDER — OXYCODONE-ACETAMINOPHEN 5-325 MG PO TABS: 1.0000 | ORAL_TABLET | Freq: Two times a day (BID) | ORAL | 0 refills | Status: DC | PRN

## 2017-01-11 MED ORDER — IBUPROFEN 800 MG PO TABS: 800.0000 mg | ORAL_TABLET | Freq: Three times a day (TID) | ORAL | 3 refills | Status: DC | PRN

## 2017-01-11 NOTE — Progress Notes (Signed)
Joshua [name] a 45 year old who is now about 10 weeks status post a left total knee arthroplasty. He is a [employer] and said he feels like he is ready to get back to work starting Monday. He's doing well overall.  On exam he is getting good quad strength and good range of motion of his left knee. There is only mild swelling. His incisions well-healed. There is some slight clicking of the prosthesis itself we talked about continued quad strength and exercises. Overall the knee feels ligament is stable.  I did we'll give him 800 mg ibuprofen to take as needed for pain as well as an occasional oxycodone. He has not been abusing these at all we talked about narcotics use. I'll need to see him back now for 6 months. If he has any issues prior to then he'll let us know. At 6 months I would like an AP and lateral of his left operative knee.

## 2017-01-15 ENCOUNTER — Ambulatory Visit (INDEPENDENT_AMBULATORY_CARE_PROVIDER_SITE_OTHER): Payer: Commercial Managed Care - PPO | Admitting: Orthopaedic Surgery

## 2017-01-30 ENCOUNTER — Telehealth (INDEPENDENT_AMBULATORY_CARE_PROVIDER_SITE_OTHER): Payer: Self-pay | Admitting: Orthopaedic Surgery

## 2017-01-30 NOTE — Telephone Encounter (Signed)
FYI---talked to patient he states his leg from knee to ankle is swollen, I told him to continue to ice and elevate. He denies any calf pain or pressure. Just wanted to let you know

## 2017-01-30 NOTE — Telephone Encounter (Signed)
Patient called having some questions about his knee surgery. If you could give him a call back when you get a chance. CB # [phone]

## 2017-06-04 ENCOUNTER — Encounter (INDEPENDENT_AMBULATORY_CARE_PROVIDER_SITE_OTHER): Payer: Self-pay | Admitting: Orthopaedic Surgery

## 2017-06-04 ENCOUNTER — Ambulatory Visit (INDEPENDENT_AMBULATORY_CARE_PROVIDER_SITE_OTHER): Payer: Commercial Managed Care - PPO

## 2017-06-04 ENCOUNTER — Ambulatory Visit (INDEPENDENT_AMBULATORY_CARE_PROVIDER_SITE_OTHER): Payer: Commercial Managed Care - PPO | Admitting: Orthopaedic Surgery

## 2017-06-04 DIAGNOSIS — Z96652 Presence of left artificial knee joint: Secondary | ICD-10-CM

## 2017-06-04 DIAGNOSIS — M1711 Unilateral primary osteoarthritis, right knee: Secondary | ICD-10-CM | POA: Diagnosis not present

## 2017-06-04 DIAGNOSIS — M25561 Pain in right knee: Secondary | ICD-10-CM

## 2017-06-04 MED ORDER — LIDOCAINE HCL 1 % IJ SOLN
3.0000 mL | INTRAMUSCULAR | Status: AC | PRN
  Administered 2017-06-04: 3 mL

## 2017-06-04 MED ORDER — METHYLPREDNISOLONE ACETATE 40 MG/ML IJ SUSP
40.0000 mg | INTRAMUSCULAR | Status: AC | PRN
  Administered 2017-06-04: 40 mg via INTRA_ARTICULAR

## 2017-06-04 NOTE — Progress Notes (Signed)
Office Visit Note   Patient: Joshua [name]           Date of Birth: 12/26/1970           MRN: [id number] Visit Date: 06/04/2017              Requested by: Alinda Deem, MD [address] [place], [place] [address] PCP: Alinda Deem, MD   Assessment & Plan: Visit Diagnoses:  1. History of left knee replacement   2. Right knee pain, unspecified chronicity   3. Unilateral primary osteoarthritis, right knee     Plan: At this point given the severity of instability of his left knee combined with severity of his arthritis that knee we are recommending a knee replacement.  I did place a steroid injection of the right knee today and will put him in a hinged knee brace for stability purposes while we await sitting up for the surgery.  All questions concerns were answered and addressed.  I do feel is medically warranted at this point given her clinical exam and radiographic exam findings.  All questions and concerns were addressed and answered  Follow-Up Instructions: Return for 2 weeks post-op.   Orders:  Orders Placed This Encounter  Procedures  . Large Joint Inj  . XR Knee 1-2 Views Left  . XR Knee 1-2 Views Right   No orders of the defined types were placed in this encounter.     Procedures: Large Joint Inj: R knee on 06/04/2017 10:19 AM Indications: diagnostic evaluation and pain Details: 22 G 1.5 in needle, superolateral approach  Arthrogram: No  Medications: 3 mL lidocaine 1 %; 40 mg methylPREDNISolone acetate 40 MG/ML Outcome: tolerated well, no immediate complications Procedure, treatment alternatives, risks and benefits explained, specific risks discussed. Consent was given by the patient. Immediately prior to procedure a time out was called to verify the correct patient, procedure, equipment, support staff and site/side marked as required. Patient was prepped and draped in the usual sterile fashion.       Clinical Data: No additional  findings.   Subjective: Chief Complaint  Patient presents with  . Left Knee - Follow-up  . Right Knee - Follow-up  The patient is a well-known patient of mine.  He is 7 months status post a left total knee arthroplasty.  He is only 47 years old but had severe arthritis in his left knee.  His right knee is been hurting quite a bit and has been catching as well.  We need to see him for both his knees today.  The right knee has become quite problematic for him and feels unstable as well.  He swells up quite a bit and is affecting his job detrimentally.  His pain is gotten to be 10 out of 10 with that knee.  It is also detrimentally affecting his active daily living and his quality of life.  HPI  Review of Systems He currently denies any headache, chest pain, shortness of breath, fever, chills, nausea, vomiting.  Objective: Vital Signs: There were no vitals taken for this visit.  Physical Exam He is alert and oriented x3 and in no acute distress Ortho Exam Examination of his left knee shows well-healed surgical incision.  The range of motion is full in the knee feels ligamentously stable with no effusion.  Examination of his right knee shows significant patellofemoral crepitation.  There is severe iliotibial band syndrome but also severe lateral joint line pain with a positive [place] Scrape  sign to lateral side.  There is also a significant clunk in his knee with instability of the knee. Specialty Comments:  No specialty comments available.  Imaging: Xr Knee 1-2 Views Left  Result Date: 06/04/2017 2 views of the left knee show a total knee arthroplasty with no complicating features.  Xr Knee 1-2 Views Right  Result Date: 06/04/2017 2 views of the right knee shows severe tricompartmental arthritic changes with almost complete loss of lateral joint line.  There are periarticular osteophytes throughout the knee which is severe posteriorly and at the patellofemoral joint.  The alignment is  neutral    PMFS History: Patient Active Problem List   Diagnosis Date Noted  . Unilateral primary osteoarthritis, right knee 06/04/2017  . Right knee pain 06/04/2017  . Unilateral primary osteoarthritis, left knee 10/31/2016  . Status post total left knee replacement 10/31/2016  . SLEEP APNEA, OBSTRUCTIVE 12/16/2008  . HYPERTENSION 12/16/2008  . BACK PAIN, LUMBAR, CHRONIC 12/16/2008  . DEEP VENOUS THROMBOPHLEBITIS, HX OF 12/16/2008  . MIGRAINES, HX OF 12/16/2008  . Other postprocedural status(V45.89) 02/28/2008   Past Medical History:  Diagnosis Date  . Arthritis    "wrists, knees, back, shoulders; everywhere" (11/01/2016)  . Chronic lower back pain   . Daily headache    "recently; related to pain in my left knee" (11/01/2016)  . 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 History of kidney stones   . HTN (hypertension)   . Hypertension   . Migraine    "might get 1/month now" (11/01/2016)  . OA (osteoarthritis) of knee    unilateral primary OA of left knee  . Obesity   . OSA (obstructive sleep apnea)    "don't wear the mask" (11/01/2016)  . Personal history of thrombophlebitis     Family History  Problem Relation Age of Onset  . Coronary artery disease Mother        premature  . Heart disease Father     Past Surgical History:  Procedure Laterality Date  . BACK SURGERY  1987/2005/2009; 03/25/2010; 2012 X 2; ?other dates   "I've had 10 total"  . CARPAL TUNNEL RELEASE Bilateral   . FOREARM SURGERY Right 2010   blood clots from my wrist to elbow" (11/01/2016)  . HAND SURGERY Right 2010   "aneurysm in my palm"  . INCISION AND DRAINAGE OF WOUND     lumbar  . JOINT REPLACEMENT    . LITHOTRIPSY  X 2  . LUMBAR DISC SURGERY  ~ 6  . LUMBAR SPINE HARDWARE REMOVAL  X 1  . POSTERIOR LUMBAR FUSION  X 2  . SHOULDER ARTHROSCOPY W/ ROTATOR CUFF REPAIR Right 2010  . TOTAL KNEE ARTHROPLASTY Left 10/31/2016   Procedure: LEFT TOTAL KNEE ARTHROPLASTY;  Surgeon: Kathryne HitchBlackman,  Thalia Turkington Y, MD;  Location: [facility] OR;  Service: Orthopedics;  Laterality: Left;   Social History   Occupational History  . Occupation: Truck Air traffic controllerdriver    Employer: [employer]  Tobacco Use  . Smoking status: Never Smoker  . Smokeless tobacco: Never Used  Substance and Sexual Activity  . Alcohol use: No  . Drug use: No  . Sexual activity: Yes

## 2017-06-05 ENCOUNTER — Telehealth (INDEPENDENT_AMBULATORY_CARE_PROVIDER_SITE_OTHER): Payer: Self-pay | Admitting: Orthopaedic Surgery

## 2017-06-05 NOTE — Telephone Encounter (Signed)
LMOM for the patient of the below message from [facility]

## 2017-06-05 NOTE — Telephone Encounter (Signed)
See below

## 2017-06-05 NOTE — Telephone Encounter (Signed)
He can put if off as long as he feels comfortable doing so.  He is not going to cause more damage.  This is usually a slow process.

## 2017-06-05 NOTE — Telephone Encounter (Signed)
Patient called asked how long can he put off surgery before it causes a lot more damage. Patient advised he is trying to start a new job. The number to contact patient is [phone]

## 2017-06-20 ENCOUNTER — Other Ambulatory Visit (INDEPENDENT_AMBULATORY_CARE_PROVIDER_SITE_OTHER): Payer: Self-pay | Admitting: Physician Assistant

## 2017-06-21 NOTE — Pre-Procedure Instructions (Signed)
Joshua JaegerEdmond A [name]  06/21/2017      [facility] - [place] - [address] [place] [address] Phone: [phone] Fax: [phone]    Your procedure is scheduled on Tuesday, March 12th   [facility] at 11:00 AM             (posted surgery time 12:58p - 2:58p)   Call this number if you have problems the morning of surgery:  [phone]   Remember:              4-5 days prior to surgery, STOP TAKING ANY Vitamins, Herbal Supplements, Anti=inflammatories.   Do not eat food or drink liquids after midnight, Monday.   Take these medicines the morning of surgery with A SIP OF WATER : Nothing    Do not wear jewelry - no rings or watches.  Do not wear lotions, colognes or deodorant.             Men may shave face and neck.  Do not bring valuables to the hospital.  [facility] is not responsible for any belongings or valuables.  Contacts, dentures or bridgework may not be worn into surgery.  Leave your suitcase in the car.  After surgery it may be brought to your room.  For patients admitted to the hospital, discharge time will be determined by your treatment team.  Please read over the following fact sheets that you were given. Pain Booklet, MRSA Information and Surgical Site Infection Prevention       [facility]- Preparing For Surgery  Before surgery, you can play an important role. Because skin is not sterile, your skin needs to be as free of germs as possible. You can reduce the number of germs on your skin by washing with CHG (chlorahexidine gluconate) Soap before surgery.  CHG is an antiseptic cleaner which kills germs and bonds with the skin to continue killing germs even after washing.  Please do not use if you have an allergy to CHG or antibacterial soaps. If your skin becomes reddened/irritated stop using the CHG.  Do not shave (including legs and underarms) for at least 48 hours prior to first CHG shower. It  is OK to shave your face.  Please follow these instructions carefully.   1. Shower the NIGHT BEFORE SURGERY and the MORNING OF SURGERY with CHG.   2. If you chose to wash your hair, wash your hair first as usual with your normal shampoo.  3. After you shampoo, rinse your hair and body thoroughly to remove the shampoo.  4. Use CHG as you would any other liquid soap. You can apply CHG directly to the skin and wash gently with a scrungie or a clean washcloth.   5. Apply the CHG Soap to your body ONLY FROM THE NECK DOWN.  Do not use on open wounds or open sores. Avoid contact with your eyes, ears, mouth and genitals (private parts). Wash Face and genitals (private parts)  with your normal soap.  6. Wash thoroughly, paying special attention to the area where your surgery will be performed.  7. Thoroughly rinse your body with warm water from the neck down.  8. DO NOT shower/wash with your normal soap after using and rinsing off the CHG Soap.  9. Pat yourself dry with a CLEAN TOWEL.  10. Wear CLEAN PAJAMAS to bed the night before surgery, wear comfortable clothes the morning of surgery  11. Place  CLEAN SHEETS on your bed the night of your first shower and DO NOT SLEEP WITH PETS.    Day of Surgery: Do not apply any deodorants/lotions. Please wear clean clothes to the hospital/surgery center.

## 2017-06-22 ENCOUNTER — Other Ambulatory Visit: Payer: Self-pay

## 2017-06-22 ENCOUNTER — Encounter (HOSPITAL_COMMUNITY)
Admission: RE | Admit: 2017-06-22 | Discharge: 2017-06-22 | Disposition: A | Discharge status: Home / Self Care | Payer: Commercial Managed Care - PPO | Source: Ambulatory Visit | Attending: Orthopaedic Surgery | Admitting: Orthopaedic Surgery

## 2017-06-22 ENCOUNTER — Encounter (HOSPITAL_COMMUNITY): Payer: Self-pay

## 2017-06-22 DIAGNOSIS — Z01812 Encounter for preprocedural laboratory examination: Secondary | ICD-10-CM | POA: Insufficient documentation

## 2017-06-22 LAB — CBC
HEMATOCRIT: 42.8 % (ref 39.0–52.0)
Hemoglobin: 13.9 g/dL (ref 13.0–17.0)
MCH: 28.1 pg (ref 26.0–34.0)
MCHC: 32.5 g/dL (ref 30.0–36.0)
MCV: 86.6 fL (ref 78.0–100.0)
PLATELETS: 200 10*3/uL (ref 150–400)
RBC: 4.94 MIL/uL (ref 4.22–5.81)
RDW: 13.8 % (ref 11.5–15.5)
WBC: 7.2 10*3/uL (ref 4.0–10.5)

## 2017-06-22 LAB — BASIC METABOLIC PANEL
Anion gap: 9 (ref 5–15)
BUN: 12 mg/dL (ref 6–20)
CALCIUM: 9.4 mg/dL (ref 8.9–10.3)
CO2: 23 mmol/L (ref 22–32)
CREATININE: 0.84 mg/dL (ref 0.61–1.24)
Chloride: 103 mmol/L (ref 101–111)
GFR calc Af Amer: >60 mL/min (ref >60)
GFR calc non Af Amer: >60 mL/min (ref >60)
GLUCOSE: 123 mg/dL — AB (ref 65–99)
Potassium: 4.3 mmol/L (ref 3.5–5.1)
Sodium: 135 mmol/L (ref 135–145)

## 2017-06-22 LAB — SURGICAL PCR SCREEN
MRSA, PCR: NEGATIVE
Staphylococcus aureus: NEGATIVE

## 2017-06-22 NOTE — Progress Notes (Signed)
   06/22/17 0810  OBSTRUCTIVE SLEEP APNEA  Have you ever been diagnosed with sleep apnea through a sleep study? Yes (tested more than 10 yrs.  came out positive.  doesn't use it)  If yes, do you have and use a CPAP or BPAP machine every night? 0  Do you snore loudly (loud enough to be heard through closed doors)?  1  Do you often feel tired, fatigued, or sleepy during the daytime (such as falling asleep during driving or talking to someone)? 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 0  Do you have, or are you being treated for high blood pressure? 1  BMI more than 35 kg/m2? 0  Age > 50 (1-yes) 0  Neck circumference greater than:Male 16 inches or larger, Male 17inches or larger? 1  Male Gender (Yes=1) 1  Obstructive Sleep Apnea Score 4  Score 5 or greater  Results sent to PCP

## 2017-06-22 NOTE — Progress Notes (Signed)
PCP is Jefm MilesSara Rhyne, [facility].  LOV 04-2017 Denies heart murmur, any cardiac problems.  He did have some thrombophlebits in ulnar area yrs ago. (note in care everywhere) Already has mupiricin at home if nasal swab comes up positive.

## 2017-07-02 MED ORDER — TRANEXAMIC ACID 1000 MG/10ML IV SOLN
1000.0000 mg | INTRAVENOUS | Status: DC
  Filled 2017-07-02: qty 10

## 2017-07-02 MED ORDER — CEFAZOLIN SODIUM 10 G IJ SOLR
3.0000 g | INTRAMUSCULAR | Status: AC
  Administered 2017-07-03: 3 g via INTRAVENOUS
  Filled 2017-07-02: qty 3

## 2017-07-03 ENCOUNTER — Inpatient Hospital Stay (HOSPITAL_COMMUNITY): Payer: Commercial Managed Care - PPO

## 2017-07-03 ENCOUNTER — Encounter (HOSPITAL_COMMUNITY)
Admission: RE | Disposition: A | Discharge status: Home Health | Payer: Self-pay | Source: Ambulatory Visit | Attending: Orthopaedic Surgery

## 2017-07-03 ENCOUNTER — Encounter (HOSPITAL_COMMUNITY): Payer: Self-pay | Admitting: Urology

## 2017-07-03 ENCOUNTER — Inpatient Hospital Stay (HOSPITAL_COMMUNITY): Payer: Commercial Managed Care - PPO | Admitting: Anesthesiology

## 2017-07-03 ENCOUNTER — Inpatient Hospital Stay (HOSPITAL_COMMUNITY)
Admission: RE | Admit: 2017-07-03 | Discharge: 2017-07-05 | DRG: 470 | Disposition: A | Discharge status: Home Health | Payer: Commercial Managed Care - PPO | Source: Ambulatory Visit | Attending: Orthopaedic Surgery | Admitting: Orthopaedic Surgery

## 2017-07-03 ENCOUNTER — Other Ambulatory Visit: Payer: Self-pay

## 2017-07-03 DIAGNOSIS — G4733 Obstructive sleep apnea (adult) (pediatric): Secondary | ICD-10-CM | POA: Diagnosis present

## 2017-07-03 DIAGNOSIS — Z96652 Presence of left artificial knee joint: Secondary | ICD-10-CM | POA: Diagnosis present

## 2017-07-03 DIAGNOSIS — M545 Low back pain: Secondary | ICD-10-CM | POA: Diagnosis present

## 2017-07-03 DIAGNOSIS — Z8672 Personal history of thrombophlebitis: Secondary | ICD-10-CM

## 2017-07-03 DIAGNOSIS — Z981 Arthrodesis status: Secondary | ICD-10-CM

## 2017-07-03 DIAGNOSIS — G8929 Other chronic pain: Secondary | ICD-10-CM | POA: Diagnosis present

## 2017-07-03 DIAGNOSIS — Z87442 Personal history of urinary calculi: Secondary | ICD-10-CM | POA: Diagnosis not present

## 2017-07-03 DIAGNOSIS — M25561 Pain in right knee: Secondary | ICD-10-CM | POA: Diagnosis present

## 2017-07-03 DIAGNOSIS — Z8249 Family history of ischemic heart disease and other diseases of the circulatory system: Secondary | ICD-10-CM | POA: Diagnosis not present

## 2017-07-03 DIAGNOSIS — Z86718 Personal history of other venous thrombosis and embolism: Secondary | ICD-10-CM

## 2017-07-03 DIAGNOSIS — I10 Essential (primary) hypertension: Secondary | ICD-10-CM | POA: Diagnosis present

## 2017-07-03 DIAGNOSIS — M1711 Unilateral primary osteoarthritis, right knee: Secondary | ICD-10-CM | POA: Diagnosis not present

## 2017-07-03 DIAGNOSIS — M25761 Osteophyte, right knee: Secondary | ICD-10-CM | POA: Diagnosis present

## 2017-07-03 DIAGNOSIS — Z6836 Body mass index (BMI) 36.0-36.9, adult: Secondary | ICD-10-CM

## 2017-07-03 DIAGNOSIS — Z96651 Presence of right artificial knee joint: Secondary | ICD-10-CM

## 2017-07-03 HISTORY — PX: TOTAL KNEE ARTHROPLASTY: SHX125

## 2017-07-03 SURGERY — ARTHROPLASTY, KNEE, TOTAL
Anesthesia: Spinal | Site: Knee | Laterality: Right

## 2017-07-03 MED ORDER — MIDAZOLAM HCL 2 MG/2ML IJ SOLN
INTRAMUSCULAR | Status: AC
  Administered 2017-07-03: 2 mg via INTRAVENOUS
  Filled 2017-07-03: qty 2

## 2017-07-03 MED ORDER — SENNOSIDES-DOCUSATE SODIUM 8.6-50 MG PO TABS: 1.0000 | ORAL_TABLET | Freq: Every evening | ORAL | Status: DC | PRN

## 2017-07-03 MED ORDER — METHOCARBAMOL 500 MG PO TABS
500.0000 mg | ORAL_TABLET | Freq: Four times a day (QID) | ORAL | Status: DC | PRN
  Administered 2017-07-04 – 2017-07-05 (×4): 500 mg via ORAL
  Filled 2017-07-03 (×4): qty 1

## 2017-07-03 MED ORDER — BUPIVACAINE IN DEXTROSE 0.75-8.25 % IT SOLN
INTRATHECAL | Status: DC | PRN
  Administered 2017-07-03: 2 mL via INTRATHECAL

## 2017-07-03 MED ORDER — OXYCODONE HCL 5 MG PO TABS
10.0000 mg | ORAL_TABLET | ORAL | Status: DC | PRN
  Administered 2017-07-04 – 2017-07-05 (×4): 15 mg via ORAL
  Filled 2017-07-03 (×4): qty 3

## 2017-07-03 MED ORDER — FENTANYL CITRATE (PF) 100 MCG/2ML IJ SOLN
INTRAMUSCULAR | Status: AC
  Administered 2017-07-03: 100 ug via INTRAVENOUS
  Filled 2017-07-03: qty 2

## 2017-07-03 MED ORDER — ONDANSETRON HCL 4 MG/2ML IJ SOLN
INTRAMUSCULAR | Status: DC | PRN
  Administered 2017-07-03: 4 mg via INTRAVENOUS

## 2017-07-03 MED ORDER — ACETAMINOPHEN 500 MG PO TABS
ORAL_TABLET | ORAL | Status: AC
  Filled 2017-07-03: qty 2

## 2017-07-03 MED ORDER — MIDAZOLAM HCL 2 MG/2ML IJ SOLN
INTRAMUSCULAR | Status: AC
  Filled 2017-07-03: qty 2

## 2017-07-03 MED ORDER — GABAPENTIN 300 MG PO CAPS
600.0000 mg | ORAL_CAPSULE | Freq: Once | ORAL | Status: AC
  Administered 2017-07-03: 600 mg via ORAL

## 2017-07-03 MED ORDER — GABAPENTIN 300 MG PO CAPS
ORAL_CAPSULE | ORAL | Status: AC
  Filled 2017-07-03: qty 2

## 2017-07-03 MED ORDER — ROPIVACAINE HCL 7.5 MG/ML IJ SOLN
INTRAMUSCULAR | Status: DC | PRN
  Administered 2017-07-03: 20 mL via PERINEURAL

## 2017-07-03 MED ORDER — DIPHENHYDRAMINE HCL 12.5 MG/5ML PO ELIX: 12.5000 mg | ORAL_SOLUTION | ORAL | Status: DC | PRN

## 2017-07-03 MED ORDER — SODIUM CHLORIDE 0.9 % IR SOLN
Status: DC | PRN
  Administered 2017-07-03: 3000 mL

## 2017-07-03 MED ORDER — CEFAZOLIN SODIUM-DEXTROSE 2-4 GM/100ML-% IV SOLN
2.0000 g | Freq: Four times a day (QID) | INTRAVENOUS | Status: AC
  Administered 2017-07-03 – 2017-07-04 (×2): 2 g via INTRAVENOUS
  Filled 2017-07-03 (×2): qty 100

## 2017-07-03 MED ORDER — DEXAMETHASONE SODIUM PHOSPHATE 10 MG/ML IJ SOLN
INTRAMUSCULAR | Status: AC
  Filled 2017-07-03: qty 1

## 2017-07-03 MED ORDER — DOCUSATE SODIUM 100 MG PO CAPS
100.0000 mg | ORAL_CAPSULE | Freq: Two times a day (BID) | ORAL | Status: DC
  Administered 2017-07-03 – 2017-07-05 (×4): 100 mg via ORAL
  Filled 2017-07-03 (×4): qty 1

## 2017-07-03 MED ORDER — BISACODYL 10 MG RE SUPP
10.0000 mg | Freq: Every day | RECTAL | Status: DC | PRN
Start: 2017-07-03 — End: 2017-07-05

## 2017-07-03 MED ORDER — PROPOFOL 10 MG/ML IV BOLUS
INTRAVENOUS | Status: DC | PRN
  Administered 2017-07-03: 50 mg via INTRAVENOUS

## 2017-07-03 MED ORDER — FENTANYL CITRATE (PF) 100 MCG/2ML IJ SOLN
100.0000 ug | Freq: Once | INTRAMUSCULAR | Status: AC
  Administered 2017-07-03: 100 ug via INTRAVENOUS

## 2017-07-03 MED ORDER — MENTHOL 3 MG MT LOZG: 1.0000 | LOZENGE | OROMUCOSAL | Status: DC | PRN

## 2017-07-03 MED ORDER — ALUM & MAG HYDROXIDE-SIMETH 200-200-20 MG/5ML PO SUSP
30.0000 mL | ORAL | Status: DC | PRN
Start: 2017-07-03 — End: 2017-07-05

## 2017-07-03 MED ORDER — METOCLOPRAMIDE HCL 5 MG/ML IJ SOLN: 5.0000 mg | Freq: Three times a day (TID) | INTRAMUSCULAR | Status: DC | PRN

## 2017-07-03 MED ORDER — MIDAZOLAM HCL 2 MG/2ML IJ SOLN
2.0000 mg | Freq: Once | INTRAMUSCULAR | Status: AC
  Administered 2017-07-03: 2 mg via INTRAVENOUS

## 2017-07-03 MED ORDER — SODIUM CHLORIDE 0.9 % IV SOLN
INTRAVENOUS | Status: DC
  Administered 2017-07-03: 19:00:00 via INTRAVENOUS

## 2017-07-03 MED ORDER — RIVAROXABAN 10 MG PO TABS
10.0000 mg | ORAL_TABLET | Freq: Every day | ORAL | Status: DC
  Administered 2017-07-04 – 2017-07-05 (×2): 10 mg via ORAL
  Filled 2017-07-03 (×2): qty 1

## 2017-07-03 MED ORDER — GABAPENTIN 300 MG PO CAPS
300.0000 mg | ORAL_CAPSULE | Freq: Three times a day (TID) | ORAL | Status: DC
  Administered 2017-07-03 – 2017-07-05 (×6): 300 mg via ORAL
  Filled 2017-07-03 (×6): qty 1

## 2017-07-03 MED ORDER — OXYCODONE HCL ER 10 MG PO T12A
10.0000 mg | EXTENDED_RELEASE_TABLET | Freq: Two times a day (BID) | ORAL | Status: DC
  Administered 2017-07-03 – 2017-07-05 (×4): 10 mg via ORAL
  Filled 2017-07-03 (×5): qty 1

## 2017-07-03 MED ORDER — OXYCODONE HCL 5 MG PO TABS
5.0000 mg | ORAL_TABLET | ORAL | Status: DC | PRN
  Administered 2017-07-03: 10 mg via ORAL
  Filled 2017-07-03: qty 2

## 2017-07-03 MED ORDER — IRBESARTAN 300 MG PO TABS
300.0000 mg | ORAL_TABLET | Freq: Every day | ORAL | Status: DC
  Administered 2017-07-04 – 2017-07-05 (×2): 300 mg via ORAL
  Filled 2017-07-03 (×2): qty 1

## 2017-07-03 MED ORDER — FENTANYL CITRATE (PF) 100 MCG/2ML IJ SOLN
INTRAMUSCULAR | Status: DC | PRN
  Administered 2017-07-03: 50 ug via INTRAVENOUS

## 2017-07-03 MED ORDER — PROPOFOL 500 MG/50ML IV EMUL
INTRAVENOUS | Status: DC | PRN
  Administered 2017-07-03: 100 ug/kg/min via INTRAVENOUS

## 2017-07-03 MED ORDER — METOCLOPRAMIDE HCL 5 MG PO TABS: 5.0000 mg | ORAL_TABLET | Freq: Three times a day (TID) | ORAL | Status: DC | PRN

## 2017-07-03 MED ORDER — 0.9 % SODIUM CHLORIDE (POUR BTL) OPTIME
TOPICAL | Status: DC | PRN
  Administered 2017-07-03: 1000 mL

## 2017-07-03 MED ORDER — FENTANYL CITRATE (PF) 250 MCG/5ML IJ SOLN
INTRAMUSCULAR | Status: AC
  Filled 2017-07-03: qty 5

## 2017-07-03 MED ORDER — ONDANSETRON HCL 4 MG/2ML IJ SOLN
INTRAMUSCULAR | Status: AC
  Filled 2017-07-03: qty 2

## 2017-07-03 MED ORDER — HYDROCHLOROTHIAZIDE 12.5 MG PO CAPS
12.5000 mg | ORAL_CAPSULE | Freq: Every day | ORAL | Status: DC
  Administered 2017-07-04 – 2017-07-05 (×2): 12.5 mg via ORAL
  Filled 2017-07-03 (×2): qty 1

## 2017-07-03 MED ORDER — METOPROLOL TARTRATE 5 MG/5ML IV SOLN
INTRAVENOUS | Status: AC
  Filled 2017-07-03: qty 5

## 2017-07-03 MED ORDER — ACETAMINOPHEN 325 MG PO TABS
325.0000 mg | ORAL_TABLET | Freq: Four times a day (QID) | ORAL | Status: DC | PRN
  Administered 2017-07-04 – 2017-07-05 (×2): 650 mg via ORAL
  Filled 2017-07-03 (×2): qty 2

## 2017-07-03 MED ORDER — ACETAMINOPHEN 500 MG PO TABS
1000.0000 mg | ORAL_TABLET | Freq: Once | ORAL | Status: AC
  Administered 2017-07-03: 1000 mg via ORAL

## 2017-07-03 MED ORDER — ZOLPIDEM TARTRATE 5 MG PO TABS
5.0000 mg | ORAL_TABLET | Freq: Every evening | ORAL | Status: DC | PRN
  Filled 2017-07-03: qty 1

## 2017-07-03 MED ORDER — ONDANSETRON HCL 4 MG/2ML IJ SOLN: 4.0000 mg | Freq: Four times a day (QID) | INTRAMUSCULAR | Status: DC | PRN

## 2017-07-03 MED ORDER — HYDROMORPHONE HCL 1 MG/ML IJ SOLN
0.5000 mg | INTRAMUSCULAR | Status: DC | PRN
  Administered 2017-07-04 (×3): 1 mg via INTRAVENOUS
  Filled 2017-07-03 (×3): qty 1

## 2017-07-03 MED ORDER — HYDROMORPHONE HCL 1 MG/ML IJ SOLN: 0.2500 mg | INTRAMUSCULAR | Status: DC | PRN

## 2017-07-03 MED ORDER — OLMESARTAN MEDOXOMIL-HCTZ 40-12.5 MG PO TABS: 1.0000 | ORAL_TABLET | Freq: Every day | ORAL | Status: DC

## 2017-07-03 MED ORDER — DEXAMETHASONE SODIUM PHOSPHATE 10 MG/ML IJ SOLN
INTRAMUSCULAR | Status: DC | PRN
  Administered 2017-07-03: 10 mg via INTRAVENOUS

## 2017-07-03 MED ORDER — CHLORHEXIDINE GLUCONATE 4 % EX LIQD: 60.0000 mL | Freq: Once | CUTANEOUS | Status: DC

## 2017-07-03 MED ORDER — PHENYLEPHRINE HCL 10 MG/ML IJ SOLN
INTRAVENOUS | Status: DC | PRN
  Administered 2017-07-03: 50 ug/min via INTRAVENOUS

## 2017-07-03 MED ORDER — PHENOL 1.4 % MT LIQD: 1.0000 | OROMUCOSAL | Status: DC | PRN

## 2017-07-03 MED ORDER — PANTOPRAZOLE SODIUM 40 MG PO TBEC
40.0000 mg | DELAYED_RELEASE_TABLET | Freq: Every day | ORAL | Status: DC
  Administered 2017-07-04 – 2017-07-05 (×2): 40 mg via ORAL
  Filled 2017-07-03 (×2): qty 1

## 2017-07-03 MED ORDER — ONDANSETRON HCL 4 MG PO TABS: 4.0000 mg | ORAL_TABLET | Freq: Four times a day (QID) | ORAL | Status: DC | PRN

## 2017-07-03 MED ORDER — LACTATED RINGERS IV SOLN
INTRAVENOUS | Status: DC | PRN
  Administered 2017-07-03 (×2): via INTRAVENOUS

## 2017-07-03 MED ORDER — MIDAZOLAM HCL 5 MG/5ML IJ SOLN
INTRAMUSCULAR | Status: DC | PRN
  Administered 2017-07-03: 2 mg via INTRAVENOUS

## 2017-07-03 MED ORDER — METHOCARBAMOL 1000 MG/10ML IJ SOLN
500.0000 mg | Freq: Four times a day (QID) | INTRAVENOUS | Status: DC | PRN
  Filled 2017-07-03: qty 5

## 2017-07-03 SURGICAL SUPPLY — 65 items
BANDAGE ACE 6X5 VEL STRL LF (GAUZE/BANDAGES/DRESSINGS) ×4 IMPLANT
BANDAGE ELASTIC 6 VELCRO ST LF (GAUZE/BANDAGES/DRESSINGS) ×4 IMPLANT
BANDAGE ESMARK 6X9 LF (GAUZE/BANDAGES/DRESSINGS) ×1 IMPLANT
BEARIN INSERT TIBIAL TRIA #5 (Orthopedic Implant) ×2 IMPLANT
BEARING INSERT TIBIAL TRIA #5 (Orthopedic Implant) ×1 IMPLANT
BENZOIN TINCTURE PRP APPL 2/3 (GAUZE/BANDAGES/DRESSINGS) ×2 IMPLANT
BLADE SAG 18X100X1.27 (BLADE) ×2 IMPLANT
BNDG ESMARK 6X9 LF (GAUZE/BANDAGES/DRESSINGS) ×2
BOWL SMART MIX CTS (DISPOSABLE) IMPLANT
CLSR STERI-STRIP ANTIMIC 1/2X4 (GAUZE/BANDAGES/DRESSINGS) ×2 IMPLANT
COVER SURGICAL LIGHT HANDLE (MISCELLANEOUS) ×2 IMPLANT
CUFF TOURNIQUET SINGLE 34IN LL (TOURNIQUET CUFF) ×2 IMPLANT
CUFF TOURNIQUET SINGLE 44IN (TOURNIQUET CUFF) IMPLANT
DRAPE EXTREMITY T 121X128X90 (DRAPE) ×2 IMPLANT
DRAPE HALF SHEET 40X57 (DRAPES) ×2 IMPLANT
DRAPE U-SHAPE 47X51 STRL (DRAPES) ×2 IMPLANT
DRSG PAD ABDOMINAL 8X10 ST (GAUZE/BANDAGES/DRESSINGS) ×2 IMPLANT
DURAPREP 26ML APPLICATOR (WOUND CARE) ×2 IMPLANT
ELECT CAUTERY BLADE 6.4 (BLADE) ×2 IMPLANT
ELECT REM PT RETURN 9FT ADLT (ELECTROSURGICAL) ×2
ELECTRODE REM PT RTRN 9FT ADLT (ELECTROSURGICAL) ×1 IMPLANT
FACESHIELD WRAPAROUND (MASK) ×4 IMPLANT
FEMORAL POSTERIOR SZ5 RT (Femur) ×1 IMPLANT
GAUZE SPONGE 4X4 12PLY STRL (GAUZE/BANDAGES/DRESSINGS) ×2 IMPLANT
GAUZE XEROFORM 1X8 LF (GAUZE/BANDAGES/DRESSINGS) ×2 IMPLANT
GLOVE BIOGEL PI IND STRL 8 (GLOVE) ×2 IMPLANT
GLOVE BIOGEL PI INDICATOR 8 (GLOVE) ×2
GLOVE ORTHO TXT STRL SZ7.5 (GLOVE) ×2 IMPLANT
GLOVE SURG ORTHO 8.0 STRL STRW (GLOVE) ×2 IMPLANT
GOWN STRL REUS W/ TWL LRG LVL3 (GOWN DISPOSABLE) IMPLANT
GOWN STRL REUS W/ TWL XL LVL3 (GOWN DISPOSABLE) ×2 IMPLANT
GOWN STRL REUS W/TWL LRG LVL3 (GOWN DISPOSABLE)
GOWN STRL REUS W/TWL XL LVL3 (GOWN DISPOSABLE) ×2
HANDPIECE INTERPULSE COAX TIP (DISPOSABLE) ×1
IMMOBILIZER KNEE 22 UNIV (SOFTGOODS) ×2 IMPLANT
KIT BASIN OR (CUSTOM PROCEDURE TRAY) ×2 IMPLANT
KIT ROOM TURNOVER OR (KITS) ×2 IMPLANT
KNEE PATELLA ASYMMETRIC 10X32 (Knees) ×2 IMPLANT
KNEE TIBIAL COMPONENT SZ5 (Knees) ×2 IMPLANT
MANIFOLD NEPTUNE II (INSTRUMENTS) ×2 IMPLANT
NDL SAFETY ECLIPSE 18X1.5 (NEEDLE) IMPLANT
NEEDLE HYPO 18GX1.5 SHARP (NEEDLE)
NS IRRIG 1000ML POUR BTL (IV SOLUTION) ×2 IMPLANT
PACK TOTAL JOINT (CUSTOM PROCEDURE TRAY) ×2 IMPLANT
PAD ARMBOARD 7.5X6 YLW CONV (MISCELLANEOUS) ×2 IMPLANT
PADDING CAST COTTON 6X4 STRL (CAST SUPPLIES) ×2 IMPLANT
POSTERIOR FEMORAL SZ5 RT (Femur) ×2 IMPLANT
SET HNDPC FAN SPRY TIP SCT (DISPOSABLE) ×1 IMPLANT
SET PAD KNEE POSITIONER (MISCELLANEOUS) ×2 IMPLANT
STAPLER VISISTAT 35W (STAPLE) IMPLANT
STRIP CLOSURE SKIN 1/2X4 (GAUZE/BANDAGES/DRESSINGS) IMPLANT
SUCTION FRAZIER HANDLE 10FR (MISCELLANEOUS) ×1
SUCTION TUBE FRAZIER 10FR DISP (MISCELLANEOUS) ×1 IMPLANT
SUT MNCRL AB 4-0 PS2 18 (SUTURE) IMPLANT
SUT VIC AB 0 CT1 27 (SUTURE) ×1
SUT VIC AB 0 CT1 27XBRD ANBCTR (SUTURE) ×1 IMPLANT
SUT VIC AB 1 CT1 27 (SUTURE) ×2
SUT VIC AB 1 CT1 27XBRD ANBCTR (SUTURE) ×2 IMPLANT
SUT VIC AB 2-0 CT1 27 (SUTURE) ×2
SUT VIC AB 2-0 CT1 TAPERPNT 27 (SUTURE) ×2 IMPLANT
SYR 50ML LL SCALE MARK (SYRINGE) IMPLANT
TOWEL OR 17X24 6PK STRL BLUE (TOWEL DISPOSABLE) ×2 IMPLANT
TOWEL OR 17X26 10 PK STRL BLUE (TOWEL DISPOSABLE) ×2 IMPLANT
TRAY CATH 16FR W/PLASTIC CATH (SET/KITS/TRAYS/PACK) IMPLANT
WRAP KNEE MAXI GEL POST OP (GAUZE/BANDAGES/DRESSINGS) ×2 IMPLANT

## 2017-07-03 NOTE — Anesthesia Procedure Notes (Signed)
Anesthesia Regional Block: Adductor canal block   Pre-Anesthetic Checklist: ,, timeout performed, Correct Patient, Correct Site, Correct Laterality, Correct Procedure, Correct Position, site marked, Risks and benefits discussed, pre-op evaluation,  At surgeon's request and post-op pain management  Laterality: Right  Prep: Maximum Sterile Barrier Precautions used, chloraprep       Needles:  Injection technique: Single-shot  Needle Type: Echogenic Stimulator Needle     Needle Length: 9cm  Needle Gauge: 21     Additional Needles:   Procedures:,,,, ultrasound used (permanent image in chart),,,,  Narrative:  Start time: 07/03/2017 10:08 AM End time: 07/03/2017 10:18 AM Injection made incrementally with aspirations every 5 mL.  Performed by: Personally  Anesthesiologist: Gaynelle AduFitzgerald, Niguel Moure, MD  Additional Notes: 2% Lidocaine skin wheel.

## 2017-07-03 NOTE — Brief Op Note (Signed)
07/03/2017  1:43 PM  PATIENT:  Joshua JaegerEdmond A [name]  47 y.o. male  PRE-OPERATIVE DIAGNOSIS:  osteoarthritis right knee  POST-OPERATIVE DIAGNOSIS:  osteoarthritis right knee  PROCEDURE:  Procedure(s): RIGHT TOTAL KNEE ARTHROPLASTY (Right)  SURGEON:  Surgeon(s) and Role:    Kathryne Hitch* Dalaney Needle Y, MD - Primary  PHYSICIAN ASSISTANT: Rexene EdisonGil Klutz, PA-C  ANESTHESIA:   regional and spinal  EBL:  30 mL   COUNTS:  YES  TOURNIQUET:   Total Tourniquet Time Documented: Thigh (Right) - 54 minutes Total: Thigh (Right) - 54 minutes   DICTATION: .Other Dictation: Dictation Number [id number]  PLAN OF CARE: Admit to inpatient   PATIENT DISPOSITION:  PACU - hemodynamically stable.   Delay start of Pharmacological VTE agent (>24hrs) due to surgical blood loss or risk of bleeding: no

## 2017-07-03 NOTE — Progress Notes (Signed)
Orthopedic Tech Progress Note Patient Details:  Joshua [name] 02/22/1971 [id number]  CPM Right Knee CPM Right Knee: On Right Knee Flexion (Degrees): 90 Right Knee Extension (Degrees): 0 Additional Comments: applied cpm to pt right knee/leg at 0-90 degrees.  pt tolerated application very well.  Bone foam provided at bedside.  Right knee/leg.   Post Interventions Patient Tolerated: Well Instructions Provided: Care of device  Alvina ChouWilliams, Jasiya Markie C 07/03/2017, 2:57 PM

## 2017-07-03 NOTE — Anesthesia Preprocedure Evaluation (Addendum)
Anesthesia Evaluation  Patient identified by MRN, date of birth, ID band Patient awake    Reviewed: Allergy & Precautions, H&P , NPO status , Patient's Chart, lab work & pertinent test results  Airway Mallampati: III  TM Distance: >3 FB Neck ROM: Full    Dental no notable dental hx. (+) Teeth Intact, Dental Advisory Given   Pulmonary sleep apnea ,    Pulmonary exam normal breath sounds clear to auscultation       Cardiovascular hypertension, Pt. on medications  Rhythm:Regular Rate:Normal     Neuro/Psych  Headaches, negative psych ROS   GI/Hepatic negative GI ROS, Neg liver ROS,   Endo/Other  Morbid obesity  Renal/GU negative Renal ROS  negative genitourinary   Musculoskeletal  (+) Arthritis , Osteoarthritis,    Abdominal   Peds  Hematology negative hematology ROS (+)   Anesthesia Other Findings   Reproductive/Obstetrics negative OB ROS                            Anesthesia Physical Anesthesia Plan  ASA: III  Anesthesia Plan: Spinal   Post-op Pain Management:  Regional for Post-op pain   Induction: Intravenous  PONV Risk Score and Plan: 2 and Ondansetron, Dexamethasone, Propofol infusion and Midazolam  Airway Management Planned: Simple Face Mask  Additional Equipment:   Intra-op Plan:   Post-operative Plan:   Informed Consent: I have reviewed the patients History and Physical, chart, labs and discussed the procedure including the risks, benefits and alternatives for the proposed anesthesia with the patient or authorized representative who has indicated his/her understanding and acceptance.   Dental advisory given  Plan Discussed with: CRNA  Anesthesia Plan Comments:         Anesthesia Quick Evaluation

## 2017-07-03 NOTE — Anesthesia Procedure Notes (Signed)
Spinal  Patient location during procedure: OR Start time: 07/03/2017 12:06 PM End time: 07/03/2017 12:11 PM Staffing Anesthesiologist: Gaynelle AduFitzgerald, Estle Huguley, MD Performed: anesthesiologist  Preanesthetic Checklist Completed: patient identified, surgical consent, pre-op evaluation, timeout performed, IV checked, risks and benefits discussed and monitors and equipment checked Spinal Block Patient position: sitting Prep: DuraPrep Patient monitoring: cardiac monitor, continuous pulse ox and blood pressure Approach: midline Location: L3-4 Injection technique: single-shot Needle Needle type: Pencan and Quincke  Needle gauge: 22 G Needle length: 9 cm Assessment Sensory level: T8 Additional Notes Functioning IV was confirmed and monitors were applied. Sterile prep and drape, including hand hygiene and sterile gloves were used. The patient was positioned and the spine was prepped. The skin was anesthetized with lidocaine.  Free flow of clear CSF was obtained prior to injecting local anesthetic into the CSF.  The spinal needle aspirated freely following injection.  The needle was carefully withdrawn.  The patient tolerated the procedure well.

## 2017-07-03 NOTE — H&P (Signed)
TOTAL KNEE ADMISSION H&P  Patient is being admitted for right total knee arthroplasty.  Subjective:  Chief Complaint:right knee pain.  HPI: Joshua [name], 47 y.o. male, has a history of pain and functional disability in the right knee due to arthritis and has failed non-surgical conservative treatments for greater than 12 weeks to includeNSAID's and/or analgesics, corticosteriod injections, viscosupplementation injections, flexibility and strengthening excercises, weight reduction as appropriate and activity modification.  Onset of symptoms was gradual, starting 3 years ago with gradually worsening course since that time. The patient noted no past surgery on the right knee(s).  Patient currently rates pain in the right knee(s) at 10 out of 10 with activity. Patient has night pain, worsening of pain with activity and weight bearing, pain that interferes with activities of daily living, pain with passive range of motion, crepitus and joint swelling.  Patient has evidence of subchondral sclerosis, periarticular osteophytes and joint space narrowing by imaging studies. There is no active infection.  Patient Active Problem List   Diagnosis Date Noted  . Unilateral primary osteoarthritis, right knee 06/04/2017  . Right knee pain 06/04/2017  . Unilateral primary osteoarthritis, left knee 10/31/2016  . Status post total left knee replacement 10/31/2016  . SLEEP APNEA, OBSTRUCTIVE 12/16/2008  . HYPERTENSION 12/16/2008  . BACK PAIN, LUMBAR, CHRONIC 12/16/2008  . DEEP VENOUS THROMBOPHLEBITIS, HX OF 12/16/2008  . MIGRAINES, HX OF 12/16/2008  . Other postprocedural status(V45.89) 02/28/2008   Past Medical History:  Diagnosis Date  . Arthritis    "wrists, knees, back, shoulders; everywhere" (11/01/2016)  . Chronic lower back pain   . Daily headache    "recently; related to pain in my left knee" (11/01/2016)  . 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 History of kidney stones    last  flare up 2017  . HTN (hypertension)   . Hypertension   . Migraine    "might get 1/month now" (11/01/2016)  . OA (osteoarthritis) of knee    unilateral primary OA of left knee  . Obesity   . OSA (obstructive sleep apnea)    "don't wear the mask" (11/01/2016)  . Personal history of thrombophlebitis    back in 2016    Past Surgical History:  Procedure Laterality Date  . BACK SURGERY  1987/2005/2009; 03/25/2010; 2012 X 2; ?other dates   "I've had 10 total"  . CARPAL TUNNEL RELEASE Bilateral   . FOREARM SURGERY Right 2010   blood clots from my wrist to elbow" (11/01/2016)  . HAND SURGERY Right 2010   "aneurysm in my palm"  . INCISION AND DRAINAGE OF WOUND     lumbar  . JOINT REPLACEMENT    . LITHOTRIPSY  X 2  . LUMBAR DISC SURGERY  ~ 6  . LUMBAR SPINE HARDWARE REMOVAL  X 1  . POSTERIOR LUMBAR FUSION  X 2  . SHOULDER ARTHROSCOPY W/ ROTATOR CUFF REPAIR Right 2010  . TOTAL KNEE ARTHROPLASTY Left 10/31/2016   Procedure: LEFT TOTAL KNEE ARTHROPLASTY;  Surgeon: Kathryne Hitch, MD;  Location: [facility] OR;  Service: Orthopedics;  Laterality: Left;    Current Facility-Administered Medications  Medication Dose Route Frequency Provider Last Rate Last Dose  . acetaminophen (TYLENOL) 500 MG tablet           . ceFAZolin (ANCEF) 3 g in dextrose 5 % 50 mL IVPB  3 g Intravenous To SSTC Kathryne Hitch, MD      . chlorhexidine (HIBICLENS) 4 % liquid 4 application  60 mL Topical Once Richardean Canallark, Gilbert W, PA-C      . gabapentin (NEURONTIN) 300 MG capsule            Facility-Administered Medications Ordered in Other Encounters  Medication Dose Route Frequency Provider Last Rate Last Dose  . ropivacaine (PF) 7.5 mg/mL (0.75%) (NAROPIN) injection   Peri-NEURAL Anesthesia Intra-op Gaynelle AduFitzgerald, William, MD   20 mL at 07/03/17 1018   No Known Allergies  Social History   Tobacco Use  . Smoking status: Never Smoker  . Smokeless tobacco: Never Used  Substance Use Topics  . Alcohol use: No     Family History  Problem Relation Age of Onset  . Coronary artery disease Mother        premature  . Heart disease Father      Review of Systems  Musculoskeletal: Positive for joint pain.  All other systems reviewed and are negative.   Objective:  Physical Exam  Constitutional: He is oriented to person, place, and time. He appears well-developed and well-nourished.  HENT:  Head: Normocephalic and atraumatic.  Eyes: EOM are normal. Pupils are equal, round, and reactive to light.  Neck: Normal range of motion. Neck supple.  Cardiovascular: Normal rate and regular rhythm.  Respiratory: Effort normal and breath sounds normal.  GI: Soft. Bowel sounds are normal.  Musculoskeletal:       Right knee: He exhibits decreased range of motion, swelling, effusion and abnormal alignment. Tenderness found. Medial joint line and lateral joint line tenderness noted.  Neurological: He is alert and oriented to person, place, and time.  Skin: Skin is warm and dry.  Psychiatric: He has a normal mood and affect.    Vital signs in last 24 hours: Temp:  [97.9 F (36.6 C)] 97.9 F (36.6 C) (03/12 1007) Pulse Rate:  [88-95] 88 (03/12 1025) Resp:  [17-18] 17 (03/12 1025) BP: (125-147)/(70-87) 129/70 (03/12 1025) SpO2:  [99 %-100 %] 100 % (03/12 1025)  Labs:   Estimated body mass index is 36.09 kg/m as calculated from the following:   Height as of 06/22/17: 6' (1.829 m).   Weight as of 06/22/17: 266 lb 1.6 oz (120.7 kg).   Imaging Review Plain radiographs demonstrate severe degenerative joint disease of the right knee(s). The overall alignment ismild varus. The bone quality appears to be excellent for age and reported activity level.  Assessment/Plan:  End stage arthritis, right knee   The patient history, physical examination, clinical judgment of the provider and imaging studies are consistent with end stage degenerative joint disease of the right knee(s) and total knee arthroplasty is  deemed medically necessary. The treatment options including medical management, injection therapy arthroscopy and arthroplasty were discussed at length. The risks and benefits of total knee arthroplasty were presented and reviewed. The risks due to aseptic loosening, infection, stiffness, patella tracking problems, thromboembolic complications and other imponderables were discussed. The patient acknowledged the explanation, agreed to proceed with the plan and consent was signed. Patient is being admitted for inpatient treatment for surgery, pain control, PT, OT, prophylactic antibiotics, VTE prophylaxis, progressive ambulation and ADL's and discharge planning. The patient is planning to be discharged home with home health services

## 2017-07-03 NOTE — Transfer of Care (Signed)
Immediate Anesthesia Transfer of Care Note  Patient: Joshua [name]  Procedure(s) Performed: RIGHT TOTAL KNEE ARTHROPLASTY (Right Knee)  Patient Location: PACU  Anesthesia Type:MAC and Spinal  Level of Consciousness: drowsy and patient cooperative  Airway & Oxygen Therapy: Patient Spontanous Breathing and Patient connected to face mask oxygen  Post-op Assessment: Report given to RN and Post -op Vital signs reviewed and stable  Post vital signs: Reviewed and stable  Last Vitals:  Vitals:   07/03/17 1020 07/03/17 1025  BP: 125/79 129/70  Pulse: 95 88  Resp: 17 17  Temp:    SpO2: 99% 100%    Last Pain: There were no vitals filed for this visit.       Complications: No apparent anesthesia complications

## 2017-07-03 NOTE — OR Nursing (Signed)
1405: in&out cath=200cc cyu, per protocol, no trauma.

## 2017-07-04 ENCOUNTER — Encounter (HOSPITAL_COMMUNITY): Payer: Self-pay | Admitting: Orthopaedic Surgery

## 2017-07-04 LAB — CBC
HEMATOCRIT: 38.9 % — AB (ref 39.0–52.0)
Hemoglobin: 12.1 g/dL — ABNORMAL LOW (ref 13.0–17.0)
MCH: 27.4 pg (ref 26.0–34.0)
MCHC: 31.1 g/dL (ref 30.0–36.0)
MCV: 88 fL (ref 78.0–100.0)
PLATELETS: 201 10*3/uL (ref 150–400)
RBC: 4.42 MIL/uL (ref 4.22–5.81)
RDW: 13.7 % (ref 11.5–15.5)
WBC: 16.3 10*3/uL — AB (ref 4.0–10.5)

## 2017-07-04 LAB — BASIC METABOLIC PANEL
ANION GAP: 11 (ref 5–15)
BUN: 14 mg/dL (ref 6–20)
CO2: 23 mmol/L (ref 22–32)
Calcium: 8.9 mg/dL (ref 8.9–10.3)
Chloride: 101 mmol/L (ref 101–111)
Creatinine, Ser: 0.9 mg/dL (ref 0.61–1.24)
Glucose, Bld: 156 mg/dL — ABNORMAL HIGH (ref 65–99)
Potassium: 4.4 mmol/L (ref 3.5–5.1)
SODIUM: 135 mmol/L (ref 135–145)

## 2017-07-04 NOTE — Anesthesia Postprocedure Evaluation (Signed)
Anesthesia Post Note  Patient: Joshua JaegerEdmond A [name]  Procedure(s) Performed: RIGHT TOTAL KNEE ARTHROPLASTY (Right Knee)     Patient location during evaluation: PACU Anesthesia Type: Spinal and Regional Level of consciousness: oriented and awake and alert Pain management: pain level controlled Vital Signs Assessment: post-procedure vital signs reviewed and stable Respiratory status: spontaneous breathing and respiratory function stable Cardiovascular status: blood pressure returned to baseline and stable Postop Assessment: no headache, no backache and no apparent nausea or vomiting Anesthetic complications: no    Last Vitals:  Vitals:   07/04/17 0433 07/04/17 0858  BP: 110/68 (!) 118/59  Pulse: 77 94  Resp:    Temp: 36.7 C 36.9 C  SpO2: 97% 98%    Last Pain:  Vitals:   07/04/17 0858  TempSrc: Oral  PainSc:                  Chas Axel,W. Dontrelle

## 2017-07-04 NOTE — Op Note (Signed)
NAMChristen Bame:  Zeitler, Eulice                ACCOUNT NO.:  [id number]  MEDICAL RECORD NO.:  [id number]  LOCATION:                                 FACILITY:  PHYSICIAN:  Vanita PandaChristopher Y. Magnus IvanBlackman, M.D.DATE OF BIRTH:  [birth year]  DATE OF PROCEDURE:  07/03/2017 DATE OF DISCHARGE:                              OPERATIVE REPORT   PREOPERATIVE DIAGNOSIS:  Primary osteoarthritis and degenerative joint disease of right knee.  POSTOPERATIVE DIAGNOSIS:  Primary osteoarthritis and degenerative joint disease of right knee.  PROCEDURE:  Right total knee arthroplasty.  IMPLANTS:  Stryker Triathlon press-fit knee system with size 5 press-fit femur, size 5 press-fit tibial tray, 11-mm fix-bearing polyethylene insert, size 32 press-fit patellar button.  SURGEON:  Vanita PandaChristopher Y. Magnus IvanBlackman, M.D.  ASSISTANT:  Richardean CanalGilbert Kalb, PA-C.  ANESTHESIA: 1. Right lower extremity adductor canal block. 2. Spinal.  BLOOD LOSS:  100 cc.  TOURNIQUET TIME:  53 minutes.  ANTIBIOTICS:  3 g of IV Ancef.  COMPLICATIONS:  None.  INDICATIONS:  [name] is a 47 year old gentleman, well known to me. He has debilitating arthritis of both his knees.  In July of last year, he underwent a successful left total knee arthroplasty.  He has done so well with that.  He wishes to proceed with a total knee arthroplasty on the right knee.  His pain is daily and it has detrimentally affected his activities of daily living, his quality of life, his mobility.  He has tried and failed all forms of conservative treatment.  He understands the risks of acute blood loss anemia, nerve and vessel injury, fracture, infection, and DVT.  He understands our goals are to decrease pain, improve mobility, and overall improved quality of life.  PROCEDURE DESCRIPTION:  After informed consent was obtained, appropriate right knee was marked.  Anesthesia obtained an adductor canal block in the holding area.  He was then brought to the operating room, sat  up on the operating table, spinal anesthesia could be obtained.  He was then laid in a supine position.  A nonsterile tourniquet was placed around his upper right thigh and his right thigh, knee, leg and ankle were prepped and draped with DuraPrep and sterile drapes including a sterile stockinette.  Time-out was called and he was identified as correct patient and correct right knee.  We then used an Esmarch to wrap out the leg and tourniquet was inflated to 300 mm of pressure.  I then made a direct midline incision over the patella and carried this proximally and distally.  We dissected down the knee joint and carried out a medial parapatellar arthrotomy, finding a large joint effusion and significant arthritis throughout his right knee.  We then removed remnants of the ACL, PCL, medial and lateral meniscus.  We set our extramedullary cutting guide for the tibia, taking 9 mm off the high side, correcting for varus, valgus and neutral slope.  We made this cut without difficulty.  We then went to the femoral side and used an intramedullary drill through the notch of the femur to set our distal femoral cutting block for a 10-mm distal femoral resection setting that block for 5 degrees externally rotated for right knee.  We made this cut without difficulty and brought the knee back down to full extension with the 9- mm extension block had achieved full extension.  We then back to the femur and put our femoral sizing guide based off the epicondylar axis. We chose a size 5 femur based off this.  We then put our 4-in-1 cutting block for a size 5 femur, and made our anterior and posterior cuts followed by our chamfer cuts.  We then made our femoral box cut for a size 5.  We then went back to the tibia and chose a size 5 tibial tray for coverage.  We set the rotation of the femur and the tibial tubercle and made our keel punch off this with a press-fit system without difficulty.  We then trialed  the size 5 tibial tray followed by the size 5 femur.  We trialed a 9-mm and 11-mm fix-bearing polyethylene insert and we were pleased with 11 mm.  We then made our patellar cut and drilled 3 holes for size 32 press-fit patellar button.  We then removed all instrumentation from the knee and irrigated the knee with normal saline solution using pulsatile lavage.  We then removed more remnants of the meniscus in the back of the knee and cauterized any vessels we could see around the knee in general.  Once we irrigated the knee again, we then placed our real press-fit Stryker Triathlon tibial tray size 5 followed by real size 5 press-fit femur.  We placed the real 11-mm fix- bearing polyethylene insert and press-fit our patellar button size 32. Once we let the tourniquet down, hemostasis obtained with electrocautery.  We closed our arthrotomy with interrupted #1 Vicryl suture followed by 0 Vicryl in the deep tissue, 2-0 Vicryl in the subcutaneous tissue, 4-0 Monocryl subcuticular stitch and Steri-Strips on the skin.  Well-padded sterile dressing was applied and he was taken to the recovery room in stable condition.  All final counts were correct.  There were no complications noted.  Of note, Richardean Canal, PA- C assisted in the entire case.  His assistance was crucial for facilitating all aspects of this case.     Vanita Panda. Magnus Ivan, M.D.     CYB/MEDQ  D:  07/03/2017  T:  07/03/2017  Job:  [id number]

## 2017-07-04 NOTE — Evaluation (Signed)
Physical Therapy Evaluation Patient Details Name: Joshua [name] MRN: [id number] DOB: 06/19/1970 Today's Date: 07/04/2017   History of Present Illness  47 y.o. male admitted on 07/03/17 for elective R TKA.  Pt with significant PMH of obesity, HTN, DVT, chronic low back pain, L TKA 10/2016, lumbar spine (multiple surgeries), DVT UE, R shoulder surgery, and R forearm surgery.    Clinical Impression  Pt is POD #1 and despite pain and stiffness, he is moving well at a min guard to supervision level.  He will progress well enough to d/c home with his wife's support.  HEP education started and will need to be reviewed.  Pt left in CPM at the end of the session.    Follow Up Recommendations Follow surgeon's recommendation for DC plan and follow-up therapies;Supervision for mobility/OOB    Equipment Recommendations  None recommended by PT    Recommendations for Other Services   NA    Precautions / Restrictions Precautions Precautions: Knee Precaution Booklet Issued: Yes (comment) Precaution Comments: knee exercise handout given  Required Braces or Orthoses: Knee Immobilizer - Right Knee Immobilizer - Right: Other (comment)(was in room, used due to unable to SLR) Restrictions RLE Weight Bearing: Weight bearing as tolerated      Mobility  Bed Mobility Overal bed mobility: Modified Independent             General bed mobility comments: HOB elevated, pt using rail  Transfers Overall transfer level: Needs assistance Equipment used: Rolling walker (2 wheeled) Transfers: Sit to/from Stand Sit to Stand: Min guard         General transfer comment: Min guard assist for safety during transitions.   Ambulation/Gait Ambulation/Gait assistance: Supervision Ambulation Distance (Feet): 110 Feet Assistive device: Rolling walker (2 wheeled) Gait Pattern/deviations: Step-through pattern;Antalgic     General Gait Details: Moderately antalgic gait pattern, good speed, safe use of RW       Balance Overall balance assessment: Needs assistance Sitting-balance support: Feet supported Sitting balance-Leahy Scale: Good     Standing balance support: Bilateral upper extremity supported Standing balance-Leahy Scale: Fair                               Pertinent Vitals/Pain Pain Assessment: 0-10 Pain Score: 6  Pain Location: right knee Pain Descriptors / Indicators: Grimacing;Guarding Pain Intervention(s): Limited activity within patient's tolerance;Monitored during session;Repositioned    Home Living Family/patient expects to be discharged to:: Private residence Living Arrangements: Spouse/significant other Available Help at Discharge: Family Type of Home: House Home Access: Stairs to enter Entrance Stairs-Rails: Right Entrance Stairs-Number of Steps: 3 Home Layout: One level Home Equipment: Environmental consultantWalker - 2 wheels;Cane - single point      Prior Function Level of Independence: Independent         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        Extremity/Trunk Assessment   Upper Extremity Assessment Upper Extremity Assessment: Defer to OT evaluation    Lower Extremity Assessment Lower Extremity Assessment: RLE deficits/detail RLE Deficits / Details: right leg with normal post op pain and weakness, ankle 3/5, knee 2/5, hip flexion 2+/5    Cervical / Trunk Assessment Cervical / Trunk Assessment: Other exceptions Cervical / Trunk Exceptions: h/o multiple back surgeries  Communication   Communication: No difficulties  Cognition Arousal/Alertness: Awake/alert Behavior During Therapy: WFL for tasks assessed/performed Overall Cognitive Status: Within Functional Limits for tasks assessed  Exercises Total Joint Exercises Ankle Circles/Pumps: AROM;Both;20 reps Quad Sets: AROM;Right;10 reps Towel Squeeze: AROM;Both;10 reps Heel Slides: AAROM;Right;10 reps Goniometric ROM: 20-75 AAROM    Assessment/Plan    PT Assessment Patient needs continued PT services  PT Problem List Decreased strength;Decreased range of motion;Decreased activity tolerance;Decreased balance;Decreased mobility;Decreased knowledge of use of DME;Pain;Decreased knowledge of precautions       PT Treatment Interventions DME instruction;Stair training;Gait training;Functional mobility training;Therapeutic exercise;Therapeutic activities;Balance training;Patient/family education;Manual techniques;Modalities    PT Goals (Current goals can be found in the Care Plan section)  Acute Rehab PT Goals Patient Stated Goal: to get back home so he can rest better PT Goal Formulation: With patient/family Time For Goal Achievement: 07/11/17 Potential to Achieve Goals: Good    Frequency 7X/week           AM-PAC PT "6 Clicks" Daily Activity  Outcome Measure Difficulty turning over in bed (including adjusting bedclothes, sheets and blankets)?: A Little Difficulty moving from lying on back to sitting on the side of the bed? : A Little Difficulty sitting down on and standing up from a chair with arms (e.g., wheelchair, bedside commode, etc,.)?: Unable Help needed moving to and from a bed to chair (including a wheelchair)?: A Little Help needed walking in hospital room?: A Little Help needed climbing 3-5 steps with a railing? : A Little 6 Click Score: 16    End of Session Equipment Utilized During Treatment: Right knee immobilizer Activity Tolerance: Patient limited by pain Patient left: in bed;with call bell/phone within reach;in CPM;with family/visitor present   PT Visit Diagnosis: Muscle weakness (generalized) (M62.81);Difficulty in walking, not elsewhere classified (R26.2);Pain Pain - Right/Left: Right Pain - part of body: Knee    Time: 1029-1105 PT Time Calculation (min) (ACUTE ONLY): 36 min   Charges:           Lurena Joiner B. Gabryelle Whitmoyer, PT, DPT [phone]   PT Evaluation $PT Eval Moderate  Complexity: 1 Mod PT Treatments $Gait Training: 8-22 mins   07/04/2017, 1:50 PM

## 2017-07-04 NOTE — Progress Notes (Signed)
Subjective: 1 Day Post-Op Procedure(s) (LRB): RIGHT TOTAL KNEE ARTHROPLASTY (Right) Patient reports pain as moderate.    Objective: Vital signs in last 24 hours: Temp:  [97.7 F (36.5 C)-98.6 F (37 C)] 98 F (36.7 C) (03/13 0433) Pulse Rate:  [62-95] 77 (03/13 0433) Resp:  [12-19] 18 (03/12 1958) BP: (92-147)/(50-87) 110/68 (03/13 0433) SpO2:  [95 %-100 %] 97 % (03/13 0433)  Intake/Output from previous day: 03/12 0701 - 03/13 0700 In: 2899.8 [P.O.:720; I.V.:2029.8] Out: 1080 [Urine:1050; Blood:30] Intake/Output this shift: No intake/output data recorded.  Recent Labs    07/04/17 0609  HGB 12.1*   Recent Labs    07/04/17 0609  WBC 16.3*  RBC 4.42  HCT 38.9*  PLT 201   No results for input(s): NA, K, CL, CO2, BUN, CREATININE, GLUCOSE, CALCIUM in the last 72 hours. No results for input(s): LABPT, INR in the last 72 hours.  Sensation intact distally Intact pulses distally Dorsiflexion/Plantar flexion intact Incision: dressing C/D/I Compartment soft  Assessment/Plan: 1 Day Post-Op Procedure(s) (LRB): RIGHT TOTAL KNEE ARTHROPLASTY (Right) Up with therapy Plan for discharge tomorrow Discharge home with home health  Joshua [name] 07/04/2017, 7:07 AM

## 2017-07-04 NOTE — Evaluation (Signed)
Occupational Therapy Evaluation Patient Details Name: Carloyn Jaegerdmond A Mimbs MRN: [id number] DOB: 05/09/1970 Today's Date: 07/04/2017    History of Present Illness 47 y.o. male admitted on 07/03/17 for elective R TKA.  Pt with significant PMH of obesity, HTN, DVT, chronic low back pain, L TKA 10/2016, lumbar spine (multiple surgeries), DVT UE, R shoulder surgery, and R forearm surgery.     Clinical Impression   Patient evaluated by Occupational Therapy with no further acute OT needs identified. All education has been completed and the patient has no further questions. See below for any follow-up Occupational Therapy or equipment needs. OT to sign off. Thank you for referral.      Follow Up Recommendations  No OT follow up    Equipment Recommendations  None recommended by OT    Recommendations for Other Services       Precautions / Restrictions Precautions Precautions: Knee Precaution Booklet Issued: Yes (comment) Precaution Comments: educated on adls and knee extension at allt imes Required Braces or Orthoses: Knee Immobilizer - Right Knee Immobilizer - Right: (was in room, used due to unable to SLR) Restrictions RLE Weight Bearing: Weight bearing as tolerated      Mobility Bed Mobility Overal bed mobility: Modified Independent             General bed mobility comments: HOB elevated, pt using rail  Transfers Overall transfer level: Needs assistance Equipment used: Rolling walker (2 wheeled) Transfers: Sit to/from Stand Sit to Stand: Supervision         General transfer comment: cues to not pull on RW    Balance Overall balance assessment: Needs assistance Sitting-balance support: Feet supported Sitting balance-Leahy Scale: Good     Standing balance support: Bilateral upper extremity supported Standing balance-Leahy Scale: Fair                             ADL either performed or assessed with clinical judgement   ADL Overall ADL's : Modified  independent                                       General ADL Comments: pt demonstrates bathroom level transfers, bed level transfers, sink level and ability to dress, pt dressed in full outfit on arrival with wife present.    Educated patient on knee full extension with return demonstration,never to wash directly on incision site, always use fresh clean linen (one time use then place in laundry), avoid water under bandage and benefits of wrapping dressing, sleeping positioning, avoid putting pillow under knee,   Vision Baseline Vision/History: No visual deficits       Perception     Praxis      Pertinent Vitals/Pain Pain Assessment: 0-10 Pain Score: 5  Pain Location: right knee Pain Descriptors / Indicators: Grimacing;Guarding Pain Intervention(s): Monitored during session;Premedicated before session;Repositioned     Hand Dominance Right   Extremity/Trunk Assessment Upper Extremity Assessment Upper Extremity Assessment: Overall WFL for tasks assessed   Lower Extremity Assessment Lower Extremity Assessment: Defer to PT evaluation RLE Deficits / Details: right leg with normal post op pain and weakness, ankle 3/5, knee 2/5, hip flexion 2+/5   Cervical / Trunk Assessment Cervical / Trunk Assessment: Other exceptions Cervical / Trunk Exceptions: h/o multiple back surgeries   Communication Communication Communication: No difficulties   Cognition Arousal/Alertness: Awake/alert Behavior During Therapy: WFL for  tasks assessed/performed Overall Cognitive Status: Within Functional Limits for tasks assessed                                     General Comments       Exercises Exercises: Total Joint Total Joint Exercises Ankle Circles/Pumps: AROM;Both;20 reps Quad Sets: AROM;Right;10 reps Towel Squeeze: AROM;Both;10 reps Heel Slides: AAROM;Right;10 reps Goniometric ROM: 20-75 AAROM   Shoulder Instructions      Home Living Family/patient  expects to be discharged to:: Private residence Living Arrangements: Spouse/significant other Available Help at Discharge: Family Type of Home: House Home Access: Stairs to enter Secretary/administrator of Steps: 3 Entrance Stairs-Rails: Right Home Layout: One level     Bathroom Shower/Tub: Chief Strategy Officer: Standard     Home Equipment: Environmental consultant - 2 wheels;Cane - single point   Additional Comments: plans to sponge bath only upon d/c      Prior Functioning/Environment Level of Independence: Independent        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 Problem List:        OT Treatment/Interventions:      OT Goals(Current goals can be found in the care plan section) Acute Rehab OT Goals Patient Stated Goal: to get home and walking in 2 weeks  OT Frequency:     Barriers to D/C:            Co-evaluation              AM-PAC PT "6 Clicks" Daily Activity     Outcome Measure Help from another person eating meals?: None Help from another person taking care of personal grooming?: None Help from another person toileting, which includes using toliet, bedpan, or urinal?: None Help from another person bathing (including washing, rinsing, drying)?: None Help from another person to put on and taking off regular upper body clothing?: None Help from another person to put on and taking off regular lower body clothing?: None 6 Click Score: 24   End of Session Equipment Utilized During Treatment: Rolling walker Nurse Communication: Mobility status  Activity Tolerance: Patient tolerated treatment well Patient left: in bed;with call bell/phone within reach;with family/visitor present  OT Visit Diagnosis: Unsteadiness on feet (R26.81)                Time: 1610-9604 OT Time Calculation (min): 13 min Charges:  OT General Charges $OT Visit: 1 Visit OT Evaluation $OT Eval Low Complexity: 1 Low G-CodesMateo Flow   OTR/L Pager:  [phone] Office: [phone] .   Boone Master B 07/04/2017, 3:12 PM

## 2017-07-04 NOTE — Progress Notes (Signed)
Physical Therapy Treatment Patient Details Name: Joshua [name] MRN: [id number] DOB: 1970/05/09 Today's Date: 07/04/2017    History of Present Illness 47 y.o. male admitted on 07/03/17 for elective R TKA.  Pt with significant PMH of obesity, HTN, DVT, chronic low back pain, L TKA 10/2016, lumbar spine (multiple surgeries), DVT UE, R shoulder surgery, and R forearm surgery.      PT Comments    Pt is POD #1 and this is his second session.  He continues to report significant pain, but was able to push himself to walk further and progress his exercises this PM.  He will need to practice stairs prior to d/c home.  PT will continue to follow acutely.   Follow Up Recommendations  Follow surgeon's recommendation for DC plan and follow-up therapies;Supervision for mobility/OOB     Equipment Recommendations  None recommended by PT    Recommendations for Other Services   NA     Precautions / Restrictions Precautions Precautions: Knee Precaution Booklet Issued: Yes (comment) Precaution Comments: knee exercise handout given and knee precaution reviewed Required Braces or Orthoses: Knee Immobilizer - Right Knee Immobilizer - Right: Other (comment)(used due to inability to preform SLR) Restrictions Weight Bearing Restrictions: Yes RLE Weight Bearing: Weight bearing as tolerated    Mobility  Bed Mobility Overal bed mobility: Modified Independent                Transfers Overall transfer level: Needs assistance Equipment used: Rolling walker (2 wheeled) Transfers: Sit to/from Stand           General transfer comment: supervision for safety, pt continues to pull up on RW.   Ambulation/Gait Ambulation/Gait assistance: Supervision Ambulation Distance (Feet): 150 Feet Assistive device: Rolling walker (2 wheeled) Gait Pattern/deviations: Step-through pattern;Antalgic Gait velocity: decreased Gait velocity interpretation: Below normal speed for age/gender General Gait Details:  Moderately antalgic gait pattern with reports of lightheadedness and weakness during gait.  He just got IV pain meds as I was coming in to start his session.           Balance Overall balance assessment: Needs assistance Sitting-balance support: Feet supported Sitting balance-Leahy Scale: Good     Standing balance support: Bilateral upper extremity supported Standing balance-Leahy Scale: Fair                              Cognition Arousal/Alertness: Awake/alert Behavior During Therapy: WFL for tasks assessed/performed Overall Cognitive Status: Within Functional Limits for tasks assessed                                        Exercises Total Joint Exercises Short Arc QuadBarbaraann Boys;Right;10 reps Hip ABduction/ADduction: AAROM;Right;10 reps Straight Leg Raises: AAROM;Right;10 reps Knee Flexion: AROM;Right;10 reps;Seated Goniometric ROM: 20-75        Pertinent Vitals/Pain Pain Assessment: 0-10 Pain Score: 8  Pain Location: right knee Pain Descriptors / Indicators: Grimacing;Guarding Pain Intervention(s): Limited activity within patient's tolerance;Monitored during session;Premedicated before session;Repositioned;Ice applied           PT Goals (current goals can now be found in the care plan section) Acute Rehab PT Goals Patient Stated Goal: decrease his pain Progress towards PT goals: Progressing toward goals    Frequency    7X/week      PT Plan Current plan remains appropriate  AM-PAC PT "6 Clicks" Daily Activity  Outcome Measure  Difficulty turning over in bed (including adjusting bedclothes, sheets and blankets)?: A Little Difficulty moving from lying on back to sitting on the side of the bed? : A Little Difficulty sitting down on and standing up from a chair with arms (e.g., wheelchair, bedside commode, etc,.)?: A Little Help needed moving to and from a bed to chair (including a wheelchair)?: None Help needed walking  in hospital room?: None Help needed climbing 3-5 steps with a railing? : A Little 6 Click Score: 20    End of Session Equipment Utilized During Treatment: Right knee immobilizer Activity Tolerance: Patient limited by pain Patient left: in chair;with call bell/phone within reach;with family/visitor present   PT Visit Diagnosis: Muscle weakness (generalized) (M62.81);Difficulty in walking, not elsewhere classified (R26.2);Pain Pain - Right/Left: Right Pain - part of body: Knee     Time: 4540-98111520-1541 PT Time Calculation (min) (ACUTE ONLY): 21 min  Charges:  $Gait Training: 8-22 mins   Kelvis Berger B. Akin Yi, PT, DPT [phone]           07/04/2017, 10:25 PM

## 2017-07-04 NOTE — Discharge Instructions (Addendum)
Information on my medicine - XARELTO® (Rivaroxaban) ° °This medication education was reviewed with me or my healthcare representative as part of my discharge preparation.   °Why was Xarelto® prescribed for you? °Xarelto® was prescribed for you to reduce the risk of blood clots forming after orthopedic surgery. The medical term for these abnormal blood clots is venous thromboembolism (VTE). ° °What do you need to know about xarelto® ? °Take your Xarelto® ONCE DAILY at the same time every day. °You may take it either with or without food. ° °If you have difficulty swallowing the tablet whole, you may crush it and mix in applesauce just prior to taking your dose. ° °Take Xarelto® exactly as prescribed by your doctor and DO NOT stop taking Xarelto® without talking to the doctor who prescribed the medication.  Stopping without other VTE prevention medication to take the place of Xarelto® may increase your risk of developing a clot. ° °After discharge, you should have regular check-up appointments with your healthcare provider that is prescribing your Xarelto®.   ° °What do you do if you miss a dose? °If you miss a dose, take it as soon as you remember on the same day then continue your regularly scheduled once daily regimen the next day. Do not take two doses of Xarelto® on the same day.  ° °Important Safety Information °A possible side effect of Xarelto® is bleeding. You should call your healthcare provider right away if you experience any of the following: °? Bleeding from an injury or your nose that does not stop. °? Unusual colored urine (red or dark brown) or unusual colored stools (red or black). °? Unusual bruising for unknown reasons. °? A serious fall or if you hit your head (even if there is no bleeding). ° °Some medicines may interact with Xarelto® and might increase your risk of bleeding while on Xarelto®. To help avoid this, consult your healthcare provider or pharmacist prior to using any new prescription  or non-prescription medications, including herbals, vitamins, non-steroidal anti-inflammatory drugs (NSAIDs) and supplements. ° °This website has more information on Xarelto®: www.xarelto.com. ° °INSTRUCTIONS AFTER JOINT REPLACEMENT  ° °o Remove items at home which could result in a fall. This includes throw rugs or furniture in walking pathways °o ICE to the affected joint every three hours while awake for 30 minutes at a time, for at least the first 3-5 days, and then as needed for pain and swelling.  Continue to use ice for pain and swelling. You may notice swelling that will progress down to the foot and ankle.  This is normal after surgery.  Elevate your leg when you are not up walking on it.   °o Continue to use the breathing machine you got in the hospital (incentive spirometer) which will help keep your temperature down.  It is common for your temperature to cycle up and down following surgery, especially at night when you are not up moving around and exerting yourself.  The breathing machine keeps your lungs expanded and your temperature down. ° ° °DIET:  As you were doing prior to hospitalization, we recommend a well-balanced diet. ° °DRESSING / WOUND CARE / SHOWERING ° °Keep the surgical dressing until follow up.  The dressing is water proof, so you can shower without any extra covering.  IF THE DRESSING FALLS OFF or the wound gets wet inside, change the dressing with sterile gauze.  Please use good hand washing techniques before changing the dressing.  Do not use any lotions   or creams on the incision until instructed by your surgeon.   ° °ACTIVITY ° °o Increase activity slowly as tolerated, but follow the weight bearing instructions below.   °o No driving for 6 weeks or until further direction given by your physician.  You cannot drive while taking narcotics.  °o No lifting or carrying greater than 10 lbs. until further directed by your surgeon. °o Avoid periods of inactivity such as sitting longer than  an hour when not asleep. This helps prevent blood clots.  °o You may return to work once you are authorized by your doctor.  ° ° ° °WEIGHT BEARING  ° °Weight bearing as tolerated with assist device (walker, cane, etc) as directed, use it as long as suggested by your surgeon or therapist, typically at least 4-6 weeks. ° ° °EXERCISES ° °Results after joint replacement surgery are often greatly improved when you follow the exercise, range of motion and muscle strengthening exercises prescribed by your doctor. Safety measures are also important to protect the joint from further injury. Any time any of these exercises cause you to have increased pain or swelling, decrease what you are doing until you are comfortable again and then slowly increase them. If you have problems or questions, call your caregiver or physical therapist for advice.  ° °Rehabilitation is important following a joint replacement. After just a few days of immobilization, the muscles of the leg can become weakened and shrink (atrophy).  These exercises are designed to build up the tone and strength of the thigh and leg muscles and to improve motion. Often times heat used for twenty to thirty minutes before working out will loosen up your tissues and help with improving the range of motion but do not use heat for the first two weeks following surgery (sometimes heat can increase post-operative swelling).  ° °These exercises can be done on a training (exercise) mat, on the floor, on a table or on a bed. Use whatever works the best and is most comfortable for you.    Use music or television while you are exercising so that the exercises are a pleasant break in your day. This will make your life better with the exercises acting as a break in your routine that you can look forward to.   Perform all exercises about fifteen times, three times per day or as directed.  You should exercise both the operative leg and the other leg as well. ° °Exercises  include: °  °• Quad Sets - Tighten up the muscle on the front of the thigh (Quad) and hold for 5-10 seconds.   °• Straight Leg Raises - With your knee straight (if you were given a brace, keep it on), lift the leg to 60 degrees, hold for 3 seconds, and slowly lower the leg.  Perform this exercise against resistance later as your leg gets stronger.  °• Leg Slides: Lying on your back, slowly slide your foot toward your buttocks, bending your knee up off the floor (only go as far as is comfortable). Then slowly slide your foot back down until your leg is flat on the floor again.  °• Angel Wings: Lying on your back spread your legs to the side as far apart as you can without causing discomfort.  °• Hamstring Strength:  Lying on your back, push your heel against the floor with your leg straight by tightening up the muscles of your buttocks.  Repeat, but this time bend your knee to a comfortable angle,   and push your heel against the floor.  You may put a pillow under the heel to make it more comfortable if necessary.  ° °A rehabilitation program following joint replacement surgery can speed recovery and prevent re-injury in the future due to weakened muscles. Contact your doctor or a physical therapist for more information on knee rehabilitation.  ° ° °CONSTIPATION ° °Constipation is defined medically as fewer than three stools per week and severe constipation as less than one stool per week.  Even if you have a regular bowel pattern at home, your normal regimen is likely to be disrupted due to multiple reasons following surgery.  Combination of anesthesia, postoperative narcotics, change in appetite and fluid intake all can affect your bowels.  ° °YOU MUST use at least one of the following options; they are listed in order of increasing strength to get the job done.  They are all available over the counter, and you may need to use some, POSSIBLY even all of these options:   ° °Drink plenty of fluids (prune juice may be  helpful) and high fiber foods °Colace 100 mg by mouth twice a day  °Senokot for constipation as directed and as needed Dulcolax (bisacodyl), take with full glass of water  °Miralax (polyethylene glycol) once or twice a day as needed. ° °If you have tried all these things and are unable to have a bowel movement in the first 3-4 days after surgery call either your surgeon or your primary doctor.   ° °If you experience loose stools or diarrhea, hold the medications until you stool forms back up.  If your symptoms do not get better within 1 week or if they get worse, check with your doctor.  If you experience "the worst abdominal pain ever" or develop nausea or vomiting, please contact the office immediately for further recommendations for treatment. ° ° °ITCHING:  If you experience itching with your medications, try taking only a single pain pill, or even half a pain pill at a time.  You can also use Benadryl over the counter for itching or also to help with sleep.  ° °TED HOSE STOCKINGS:  Use stockings on both legs until for at least 2 weeks or as directed by physician office. They may be removed at night for sleeping. ° °MEDICATIONS:  See your medication summary on the “After Visit Summary” that nursing will review with you.  You may have some home medications which will be placed on hold until you complete the course of blood thinner medication.  It is important for you to complete the blood thinner medication as prescribed. ° °PRECAUTIONS:  If you experience chest pain or shortness of breath - call 911 immediately for transfer to the hospital emergency department.  ° °If you develop a fever greater that 101 F, purulent drainage from wound, increased redness or drainage from wound, foul odor from the wound/dressing, or calf pain - CONTACT YOUR SURGEON.   °                                                °FOLLOW-UP APPOINTMENTS:  If you do not already have a post-op appointment, please call the office for an  appointment to be seen by your surgeon.  Guidelines for how soon to be seen are listed in your “After Visit Summary”, but are typically between 1-4   weeks after surgery. ° °OTHER INSTRUCTIONS:  ° °Knee Replacement:  Do not place pillow under knee, focus on keeping the knee straight while resting. CPM instructions: 0-90 degrees, 2 hours in the morning, 2 hours in the afternoon, and 2 hours in the evening. Place foam block, curve side up under heel at all times except when in CPM or when walking.  DO NOT modify, tear, cut, or change the foam block in any way. ° °MAKE SURE YOU:  °• Understand these instructions.  °• Get help right away if you are not doing well or get worse.  ° ° °Thank you for letting us be a part of your medical care team.  It is a privilege we respect greatly.  We hope these instructions will help you stay on track for a fast and full recovery!  ° °

## 2017-07-05 MED ORDER — METHOCARBAMOL 500 MG PO TABS: 500.0000 mg | ORAL_TABLET | Freq: Four times a day (QID) | ORAL | 1 refills | Status: DC | PRN

## 2017-07-05 MED ORDER — OXYCODONE HCL 5 MG PO TABS: 5.0000 mg | ORAL_TABLET | ORAL | 0 refills | Status: DC | PRN

## 2017-07-05 MED ORDER — RIVAROXABAN 10 MG PO TABS: 10.0000 mg | ORAL_TABLET | Freq: Every day | ORAL | 0 refills | Status: AC

## 2017-07-05 NOTE — Care Management Note (Addendum)
Case Management Note  Patient Details  Name: Joshua [name] MRN: [id number] Date of Birth: 07/04/1970  Subjective/Objective:                 Spoke to patient at bedside. He is from with wife who will provide support after DC. He has all DME needed from previous knee sx. [facility], verified with Rebecca EatonBrownlee w [facility]. No other CM needs identified.  Received call back from [facility] stating they could not take patient. Attempted to [facility] and was able to connect with [facility] who can start care Monday/ Tuesday. Spoke w patient, post DC and he stated that would be fine.    Action/Plan:   Expected Discharge Date:  07/05/17               Expected Discharge Plan:  Home w Home Health Services  In-House Referral:     Discharge planning Services  CM Consult  Post Acute Care Choice:    Choice offered to:  Patient  DME Arranged:    DME Agency:     HH Arranged:  PT HH [facility]  [facility]now [facility]  Status of Service:  Completed, signed off  If discussed at [employer] Length of Stay Meetings, dates discussed:    Additional Comments:  Joshua SabalDebbie Shalev Helminiak, RN 07/05/2017, 1:21 PM

## 2017-07-05 NOTE — Progress Notes (Signed)
Patient ID: Joshua [name], male   DOB: 04/28/1970, 47 y.o.   MRN: [id number] Doing well.  Can be discharged to home this afternoon.

## 2017-07-05 NOTE — Plan of Care (Signed)
  Progressing Education: Knowledge of General Education information will improve 07/05/2017 0939 - Progressing by Quentin CornwallMadison, Makoto Sellitto, RN Health Behavior/Discharge Planning: Ability to manage health-related needs will improve 07/05/2017 0939 - Progressing by Quentin CornwallMadison, Yanil Dawe, RN Clinical Measurements: Ability to maintain clinical measurements within normal limits will improve 07/05/2017 0939 - Progressing by Quentin CornwallMadison, Coltan Spinello, RN Will remain free from infection 07/05/2017 0939 - Progressing by Quentin CornwallMadison, Cierah Crader, RN Diagnostic test results will improve 07/05/2017 0939 - Progressing by Quentin CornwallMadison, Yanuel Tagg, RN Respiratory complications will improve 07/05/2017 0939 - Progressing by Quentin CornwallMadison, Tiare Rohlman, RN Cardiovascular complication will be avoided 07/05/2017 0939 - Progressing by Quentin CornwallMadison, Oluwatoni Rotunno, RN Activity: Risk for activity intolerance will decrease 07/05/2017 0939 - Progressing by Quentin CornwallMadison, Miloh Alcocer, RN Nutrition: Adequate nutrition will be maintained 07/05/2017 0939 - Progressing by Quentin CornwallMadison, Rajanae Mantia, RN Coping: Level of anxiety will decrease 07/05/2017 0939 - Progressing by Quentin CornwallMadison, Areeba Sulser, RN Elimination: Will not experience complications related to bowel motility 07/05/2017 0939 - Progressing by Quentin CornwallMadison, Marrietta Thunder, RN Will not experience complications related to urinary retention 07/05/2017 0939 - Progressing by Quentin CornwallMadison, Zuriah Bordas, RN Pain Managment: General experience of comfort will improve 07/05/2017 0939 - Progressing by Quentin CornwallMadison, Raman Featherston, RN Safety: Ability to remain free from injury will improve 07/05/2017 0939 - Progressing by Quentin CornwallMadison, Livian Vanderbeck, RN Skin Integrity: Risk for impaired skin integrity will decrease 07/05/2017 0939 - Progressing by Quentin CornwallMadison, Yamato Kopf, RN

## 2017-07-05 NOTE — Progress Notes (Signed)
Pt discharged home with wife. AVS and scripts given to and reviewed in full with patient and wife. HH to be setup by CM, [name], who will give patient a call this afternoon with service. All belongings sent with patient. VSS. BP 131/85 (BP Location: Right Arm)   Pulse 89   Temp 98.9 F (37.2 C) (Oral)   Resp 18   SpO2 99%

## 2017-07-05 NOTE — Progress Notes (Signed)
Physical Therapy Treatment Patient Details Name: Joshua [name] MRN: [id number] DOB: 10/17/1970 Today's Date: 07/05/2017    History of Present Illness 47 y.o. male admitted on 07/03/17 for elective R TKA.  Pt with significant PMH of obesity, HTN, DVT, chronic low back pain, L TKA 10/2016, lumbar spine (multiple surgeries), DVT UE, R shoulder surgery, and R forearm surgery.      PT Comments    Patient is progressing very well towards their physical therapy goals. Able to increase ambulation distance to 300 feet with multiple standing rest breaks and one seated break. Attempted to progress to step through pattern but ultimately unable due to pain and inflammation. Session mainly focused on stair training; patient was able to negotiate 3 steps sideways with bilateral hand support on railings in order to prepare for discharge home.    Follow Up Recommendations  Follow surgeon's recommendation for DC plan and follow-up therapies;Supervision for mobility/OOB     Equipment Recommendations  None recommended by PT    Recommendations for Other Services       Precautions / Restrictions Precautions Precautions: Knee Precaution Booklet Issued: Yes (comment) Required Braces or Orthoses: Knee Immobilizer - Right Knee Immobilizer - Right: Other (comment)(used due to inability to preform SLR) Restrictions Weight Bearing Restrictions: Yes RLE Weight Bearing: Weight bearing as tolerated    Mobility  Bed Mobility Overal bed mobility: Modified Independent                Transfers Overall transfer level: Needs assistance Equipment used: Rolling walker (2 wheeled) Transfers: Sit to/from Stand           General transfer comment: Supervision for safety. VC's for hand placement.   Ambulation/Gait Ambulation/Gait assistance: Supervision Ambulation Distance (Feet): 300 Feet Assistive device: Rolling walker (2 wheeled) Gait Pattern/deviations: Antalgic;Step-to pattern;Decreased step length  - left Gait velocity: decreased Gait velocity interpretation: Below normal speed for age/gender General Gait Details: Patient with step through pattern and provided VC's to progress to step through pattern including taking a shorter R step length in order to progress LLE through; patient unable to achieve full step through pattern. VC's for RW proximity. Patient required multiple standing rest breaks and one seated rest break.     Stairs Stairs: Yes   Stair Management: Two rails;Sideways;One rail Right Number of Stairs: 3 General stair comments: Patient climbed steps sideways alternating between bilateral hand support on R rail versus two railings to gain more leverage. VC's provided for leaving enough space for other foot when stepping up and down.   Wheelchair Mobility    Modified Rankin (Stroke Patients Only)       Balance Overall balance assessment: Needs assistance Sitting-balance support: Feet supported Sitting balance-Leahy Scale: Good     Standing balance support: Bilateral upper extremity supported Standing balance-Leahy Scale: Fair                              Cognition Arousal/Alertness: Awake/alert Behavior During Therapy: WFL for tasks assessed/performed Overall Cognitive Status: Within Functional Limits for tasks assessed                                        Exercises      General Comments        Pertinent Vitals/Pain Pain Assessment: Faces Faces Pain Scale: Hurts even more Pain Location: right knee Pain Descriptors /  Indicators: Grimacing;Guarding Pain Intervention(s): Monitored during session;Premedicated before session    Home Living                      Prior Function            PT Goals (current goals can now be found in the care plan section) Acute Rehab PT Goals Patient Stated Goal: decrease his pain PT Goal Formulation: With patient Time For Goal Achievement: 07/11/17 Potential to Achieve  Goals: Good Progress towards PT goals: Progressing toward goals    Frequency    7X/week      PT Plan Current plan remains appropriate    Co-evaluation              AM-PAC PT "6 Clicks" Daily Activity  Outcome Measure  Difficulty turning over in bed (including adjusting bedclothes, sheets and blankets)?: None Difficulty moving from lying on back to sitting on the side of the bed? : None Difficulty sitting down on and standing up from a chair with arms (e.g., wheelchair, bedside commode, etc,.)?: A Little Help needed moving to and from a bed to chair (including a wheelchair)?: A Little Help needed walking in hospital room?: A Little Help needed climbing 3-5 steps with a railing? : A Little 6 Click Score: 20    End of Session Equipment Utilized During Treatment: Right knee immobilizer Activity Tolerance: Patient limited by pain Patient left: in chair;with call bell/phone within reach;with family/visitor present   PT Visit Diagnosis: Muscle weakness (generalized) (M62.81);Difficulty in walking, not elsewhere classified (R26.2);Pain Pain - Right/Left: Right Pain - part of body: Knee     Time: 1610-9604 PT Time Calculation (min) (ACUTE ONLY): 37 min  Charges:  $Gait Training: 8-22 mins $Therapeutic Activity: 8-22 mins                    G Codes:       Laurina Bustle, PT, DPT Acute Rehabilitation Services     Vanetta Mulders 07/05/2017, 9:50 AM

## 2017-07-05 NOTE — Discharge Summary (Signed)
Patient ID: Joshua [name] MRN: [id number] DOB/AGE: 1971/04/24 47 y.o.  Admit date: 07/03/2017 Discharge date: 07/05/2017  Admission Diagnoses:  Principal Problem:   Unilateral primary osteoarthritis, right knee Active Problems:   Status post total right knee replacement   Discharge Diagnoses:  Same  Past Medical History:  Diagnosis Date  . Arthritis    "wrists, knees, back, shoulders; everywhere" (11/01/2016)  . Chronic lower back pain   . Daily headache    "recently; related to pain in my left knee" (11/01/2016)  . 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 History of kidney stones    last flare up 2017  . HTN (hypertension)   . Hypertension   . Migraine    "might get 1/month now" (11/01/2016)  . OA (osteoarthritis) of knee    unilateral primary OA of left knee  . Obesity   . OSA (obstructive sleep apnea)    "don't wear the mask" (11/01/2016)  . Personal history of thrombophlebitis    back in 2016    Surgeries: Procedure(s): RIGHT TOTAL KNEE ARTHROPLASTY on 07/03/2017   Consultants:   Discharged Condition: Improved  Hospital Course: Joshua [name] is an 47 y.o. male who was admitted 07/03/2017 for operative treatment ofUnilateral primary osteoarthritis, right knee. Patient has severe unremitting pain that affects sleep, daily activities, and work/hobbies. After pre-op clearance the patient was taken to the operating room on 07/03/2017 and underwent  Procedure(s): RIGHT TOTAL KNEE ARTHROPLASTY.    Patient was given perioperative antibiotics:  Anti-infectives (From admission, onward)   Start     Dose/Rate Route Frequency Ordered Stop   07/03/17 1900  ceFAZolin (ANCEF) IVPB 2g/100 mL premix     2 g 200 mL/hr over 30 Minutes Intravenous Every 6 hours 07/03/17 1814 07/04/17 0128   07/03/17 1000  ceFAZolin (ANCEF) 3 g in dextrose 5 % 50 mL IVPB     3 g 130 mL/hr over 30 Minutes Intravenous To Short Stay 07/02/17 0839 07/03/17 1216       Patient was given  sequential compression devices, early ambulation, and chemoprophylaxis to prevent DVT.  Patient benefited maximally from hospital stay and there were no complications.    Recent vital signs:  Patient Vitals for the past 24 hrs:  BP Temp Temp src Pulse Resp SpO2  07/05/17 0525 129/79 99 F (37.2 C) Oral (!) 117 20 96 %  07/04/17 1900 (!) 151/80 98.9 F (37.2 C) Oral 93 20 100 %     Recent laboratory studies:  Recent Labs    07/04/17 0609  WBC 16.3*  HGB 12.1*  HCT 38.9*  PLT 201  NA 135  K 4.4  CL 101  CO2 23  BUN 14  CREATININE 0.90  GLUCOSE 156*  CALCIUM 8.9     Discharge Medications:   Allergies as of 07/05/2017   No Known Allergies     Medication List    STOP taking these medications   ibuprofen 800 MG tablet Commonly known as:  ADVIL,MOTRIN     TAKE these medications   methocarbamol 500 MG tablet Commonly known as:  ROBAXIN Take 1 tablet (500 mg total) by mouth every 6 (six) hours as needed for muscle spasms.   olmesartan-hydrochlorothiazide 40-12.5 MG tablet Commonly known as:  BENICAR HCT Take 1 tablet by mouth daily.   oxyCODONE 5 MG immediate release tablet Commonly known as:  Oxy IR/ROXICODONE Take 1-2 tablets (5-10 mg total) by mouth every 4 (four) hours as needed for moderate  pain (pain score 4-6).   oxyCODONE-acetaminophen 5-325 MG tablet Commonly known as:  ROXICET Take 1-2 tablets by mouth 2 (two) times daily as needed for severe pain.   rivaroxaban 10 MG Tabs tablet Commonly known as:  XARELTO Take 1 tablet (10 mg total) by mouth daily.            Durable Medical Equipment  (From admission, onward)        Start     Ordered   07/03/17 1815  DME 3 n 1  Once     07/03/17 1814   07/03/17 1815  DME Walker rolling  Once    Question:  Patient needs a walker to treat with the following condition  Answer:  Status post total right knee replacement   07/03/17 1814      Diagnostic Studies: Dg Knee Right Port  Result Date:  07/03/2017 CLINICAL DATA:  Total right knee replacement. EXAM: PORTABLE RIGHT KNEE - 1-2 VIEW COMPARISON:  06/04/2016. FINDINGS: Total right knee replacement with anatomic alignment. Hardware intact. No acute bony abnormality. Deformity noted of the fibula consistent with old healed injury. IMPRESSION: Total right knee replacement with anatomic alignment. Electronically Signed   By: Maisie Fushomas  Register   On: 07/03/2017 14:31    Disposition: 06-Home-Health Care Svc    Follow-up Information    Kathryne HitchBlackman, Adwoa Axe Y, MD Follow up in 2 week(s).   Specialty:  Orthopedic Surgery Contact information: [address] [place] [address] [phone]            Signed: Kathryne HitchChristopher Y Marilouise Densmore 07/05/2017, 12:07 PM

## 2017-07-05 NOTE — Progress Notes (Signed)
Notified [name] with Case Management [facility].

## 2017-07-09 ENCOUNTER — Telehealth (INDEPENDENT_AMBULATORY_CARE_PROVIDER_SITE_OTHER): Payer: Self-pay | Admitting: Orthopaedic Surgery

## 2017-07-09 NOTE — Telephone Encounter (Signed)
IC verbal given.  

## 2017-07-09 NOTE — Telephone Encounter (Signed)
[name] with [facility] requesting VO for the patient for the following:  3x a week for 3 weeks starting March 17th.  It is okay to leave a vm message on her number.  CB#[phone].  Thank you.

## 2017-07-10 ENCOUNTER — Telehealth (INDEPENDENT_AMBULATORY_CARE_PROVIDER_SITE_OTHER): Payer: Self-pay | Admitting: *Deleted

## 2017-07-10 NOTE — Telephone Encounter (Signed)
[place] [facility] [place] [facility] stating pt has been approved for Physical Therapy with ref # [id number] valid for 12 visits beginning 03/174/19-08/16/19.

## 2017-07-11 ENCOUNTER — Ambulatory Visit (INDEPENDENT_AMBULATORY_CARE_PROVIDER_SITE_OTHER): Payer: Commercial Managed Care - PPO | Admitting: Orthopaedic Surgery

## 2017-07-16 ENCOUNTER — Ambulatory Visit (INDEPENDENT_AMBULATORY_CARE_PROVIDER_SITE_OTHER): Payer: Commercial Managed Care - PPO | Admitting: Physician Assistant

## 2017-07-16 ENCOUNTER — Encounter (INDEPENDENT_AMBULATORY_CARE_PROVIDER_SITE_OTHER): Payer: Self-pay | Admitting: Physician Assistant

## 2017-07-16 DIAGNOSIS — Z96651 Presence of right artificial knee joint: Secondary | ICD-10-CM

## 2017-07-16 NOTE — Progress Notes (Signed)
HPI: Joshua [name] returns 2 weeks status post right total knee arthroplasty.  He is overall doing well denies any fevers chills shortness of breath chest pain.  He is ambulating with a cane.  He is discharged from home therapy to therapy on his own he states insurance does not cover outpatient physical therapy.  This was the case with his left total knee.  Physical exam:  General: Well-developed well-nourished male in no acute distress mood affect appropriate.  Psych alert and oriented x3 Right knee: Surgical incisions healing well no signs of infection.  Wound is closed with a subcu stitch there is no evidence of dehiscence.  Right calf supple nontender.  Full extension of the right knee flexion 95 degrees no instability valgus varus stressing.  Impression: Status post right total knee arthroplasty  Plan: He will continue to work on range of motion strengthening knee.  Steri-Strips were applied he will leave these on until they fall off.  Is able to get the incision wet.  We will see him back in 1 month check his progress or lack of.  Questions encouraged and answered at length.

## 2017-07-27 ENCOUNTER — Other Ambulatory Visit (INDEPENDENT_AMBULATORY_CARE_PROVIDER_SITE_OTHER): Payer: Self-pay | Admitting: Orthopaedic Surgery

## 2017-07-27 ENCOUNTER — Telehealth (INDEPENDENT_AMBULATORY_CARE_PROVIDER_SITE_OTHER): Payer: Self-pay | Admitting: Orthopaedic Surgery

## 2017-07-27 MED ORDER — OXYCODONE HCL 5 MG PO TABS: 5.0000 mg | ORAL_TABLET | ORAL | 0 refills | Status: DC | PRN

## 2017-07-27 MED ORDER — IBUPROFEN 800 MG PO TABS: 800.0000 mg | ORAL_TABLET | Freq: Three times a day (TID) | ORAL | 0 refills | Status: DC | PRN

## 2017-07-27 MED ORDER — TIZANIDINE HCL 4 MG PO TABS: 4.0000 mg | ORAL_TABLET | Freq: Three times a day (TID) | ORAL | 0 refills | Status: DC | PRN

## 2017-07-27 NOTE — Telephone Encounter (Signed)
IC LM for therapist advising ok to extend. I also tried to call patient to discuss. No answer. LM for him advising per Dr Magnus IvanBlackman.

## 2017-07-27 NOTE — Telephone Encounter (Signed)
[facility] called stating that the patients right knee has been hurting him pretty badly, he's iced it, elevated it, taken all his medicine like he should and is still having issues with it. Sabra called asking if he could possibly get a higher dosage of oxycodone temporarily and also wanted to get an extension on his PT. CB #  [phone]

## 2017-07-27 NOTE — Telephone Encounter (Signed)
Please advise 

## 2017-07-27 NOTE — Telephone Encounter (Signed)
I did send in some more oxycodone, but not a higher dose.  Instead, I also sent in 800 mg ibuprofen and Zanaflex to try with it.  Also ok to extend his physical therapy.

## 2017-08-07 ENCOUNTER — Telehealth (INDEPENDENT_AMBULATORY_CARE_PROVIDER_SITE_OTHER): Payer: Self-pay | Admitting: Orthopaedic Surgery

## 2017-08-07 NOTE — Telephone Encounter (Signed)
error 

## 2017-08-08 ENCOUNTER — Ambulatory Visit (INDEPENDENT_AMBULATORY_CARE_PROVIDER_SITE_OTHER): Payer: Commercial Managed Care - PPO | Admitting: Orthopaedic Surgery

## 2017-08-08 ENCOUNTER — Encounter (INDEPENDENT_AMBULATORY_CARE_PROVIDER_SITE_OTHER): Payer: Self-pay | Admitting: Orthopaedic Surgery

## 2017-08-08 DIAGNOSIS — Z96651 Presence of right artificial knee joint: Secondary | ICD-10-CM

## 2017-08-08 NOTE — Progress Notes (Signed)
HPI: Mr. Chestine SporeClark returns today 36 days status post right total knee arthroplasty.  He states he has swelling at the end of the day and the knee is warm to touch again with a temperature of 100 degrees last night.  Stem today is 98.2 F.  Have no chest pain shortness breath.  States the knee gets tight to the point that he has difficulty with range of motion.  Physical exam right knee surgical incisions healed well no signs of infection no drainage.  No abnormal warmth swelling about the knee.  Calf supple nontender.  He has full range of motion of the knee without pain.  He does not appear to have a  septic joint.  Impression: Status post right total knee arthroplasty 36 days Plan: We will see him back in 3 weeks to check his progress lack.  Reassurance was given Could have swelling and warmth in the knee for 6 months to a year after the replacement.  Suggested he start wearing compression hose during the day did give him the address and phone number to last therapies and [place] as [place] in [place].  He will follow-up with us sooner if there is any questions or concerns.

## 2017-08-09 ENCOUNTER — Telehealth (INDEPENDENT_AMBULATORY_CARE_PROVIDER_SITE_OTHER): Payer: Self-pay | Admitting: Orthopaedic Surgery

## 2017-08-09 NOTE — Telephone Encounter (Signed)
[facility] called in regards to the plan of care that was faxed on April 10th. She is also refaxing the documents.  CB#[phone].  Thank you

## 2017-08-20 ENCOUNTER — Ambulatory Visit (INDEPENDENT_AMBULATORY_CARE_PROVIDER_SITE_OTHER): Payer: Commercial Managed Care - PPO | Admitting: Physician Assistant

## 2017-08-27 ENCOUNTER — Ambulatory Visit (INDEPENDENT_AMBULATORY_CARE_PROVIDER_SITE_OTHER): Payer: Commercial Managed Care - PPO | Admitting: Orthopaedic Surgery

## 2017-08-27 ENCOUNTER — Encounter (INDEPENDENT_AMBULATORY_CARE_PROVIDER_SITE_OTHER): Payer: Self-pay | Admitting: Orthopaedic Surgery

## 2017-08-27 DIAGNOSIS — Z96652 Presence of left artificial knee joint: Secondary | ICD-10-CM

## 2017-08-27 DIAGNOSIS — Z96651 Presence of right artificial knee joint: Secondary | ICD-10-CM

## 2017-08-27 MED ORDER — TEMAZEPAM 7.5 MG PO CAPS: 7.5000 mg | ORAL_CAPSULE | Freq: Every evening | ORAL | 0 refills | Status: DC | PRN

## 2017-08-27 NOTE — Progress Notes (Signed)
The patient is now 7 weeks status post a right total knee arthroplasty in 10 months status post a left total knee arthroplasty.  He said he has had a harder time getting over the right knee.  He is only 48 years old.  He does feel that he can go back to work next week but is not sleeping well.  He hopes to go back just for half days only for the first 2 weeks and I agree with this.  On examination knee is still slightly swollen on the right side comparing the right left side of his range of motion is excellent in the knee feels mostly stable.  He still feels like it is unstable going downstairs.  I talked about working on quad strengthening exercises.  Knee feels ligamentously stable to me.  I gave him a note allowing him to return to work next week but half days only for the first 2 weeks.  Also send in Restoril as a sleep medication but he will use it sparingly.  We will see him back in a month see how is doing overall but no x-rays are needed.

## 2017-08-28 ENCOUNTER — Other Ambulatory Visit (INDEPENDENT_AMBULATORY_CARE_PROVIDER_SITE_OTHER): Payer: Self-pay

## 2017-08-28 MED ORDER — TEMAZEPAM 15 MG PO CAPS: 15.0000 mg | ORAL_CAPSULE | Freq: Every evening | ORAL | 0 refills | Status: DC | PRN

## 2017-08-29 ENCOUNTER — Ambulatory Visit (INDEPENDENT_AMBULATORY_CARE_PROVIDER_SITE_OTHER): Payer: Commercial Managed Care - PPO | Admitting: Orthopaedic Surgery

## 2017-09-13 ENCOUNTER — Telehealth (INDEPENDENT_AMBULATORY_CARE_PROVIDER_SITE_OTHER): Payer: Self-pay | Admitting: Orthopaedic Surgery

## 2017-09-13 ENCOUNTER — Encounter (INDEPENDENT_AMBULATORY_CARE_PROVIDER_SITE_OTHER): Payer: Self-pay

## 2017-09-13 NOTE — Telephone Encounter (Signed)
please advise.

## 2017-09-13 NOTE — Telephone Encounter (Signed)
Patient called with a fax# to fax over note. The fax# is [phone]  Attn: Milana Kidney   Please mark urgent  The number to contact patient is [phone]

## 2017-09-13 NOTE — Telephone Encounter (Signed)
Patient called needing a note listing work restrictions for another week for his employer. Patient asked if he can get this information as soon as possible. The number to contact patient is [phone]

## 2017-09-13 NOTE — Telephone Encounter (Signed)
Will call back with fax number for me to send note to

## 2017-09-13 NOTE — Telephone Encounter (Signed)
His work restrictions to include no climbing ladders or crawling or stooping.  He should not lift anything greater than 30 pounds until further notice.  Patient also not stand for long period of time or walk long distances for further notice.  This should apply for at least the next 6 to 8 weeks.

## 2017-09-14 NOTE — Telephone Encounter (Signed)
Faxed to provided number  

## 2017-09-18 ENCOUNTER — Telehealth (INDEPENDENT_AMBULATORY_CARE_PROVIDER_SITE_OTHER): Payer: Self-pay | Admitting: Orthopaedic Surgery

## 2017-09-18 NOTE — Telephone Encounter (Signed)
This ok? 

## 2017-09-18 NOTE — Telephone Encounter (Signed)
Patient wants to know if fax was sent to Employer about working Part-time.  Please call patient to advise.

## 2017-09-18 NOTE — Telephone Encounter (Signed)
That will be fine. 

## 2017-09-18 NOTE — Telephone Encounter (Signed)
Patient called asking for another work note for his employer stating that he is on part time restrictions for another week. If it could be sent to [phone] Attn: Zella Ball

## 2017-09-19 ENCOUNTER — Encounter (INDEPENDENT_AMBULATORY_CARE_PROVIDER_SITE_OTHER): Payer: Self-pay

## 2017-09-19 ENCOUNTER — Telehealth (INDEPENDENT_AMBULATORY_CARE_PROVIDER_SITE_OTHER): Payer: Self-pay | Admitting: Orthopaedic Surgery

## 2017-09-19 NOTE — Telephone Encounter (Signed)
Patient called inquiring about his note for part time restrictions wanting to know if it has been faxed to his employer.  The fax number is [phone], Attn: Robin.  CB#[phone].  Thank you.

## 2017-09-19 NOTE — Telephone Encounter (Signed)
Message sent in error

## 2017-09-19 NOTE — Telephone Encounter (Signed)
faaxed 

## 2017-09-20 ENCOUNTER — Telehealth (INDEPENDENT_AMBULATORY_CARE_PROVIDER_SITE_OTHER): Payer: Self-pay | Admitting: Orthopaedic Surgery

## 2017-09-20 NOTE — Telephone Encounter (Signed)
Please advise 

## 2017-09-20 NOTE — Telephone Encounter (Signed)
Patient called asked if a note was sent to his employer stating he can continue to work part time or if Dr Magnus Ivan want him to remain out of work until he sees him on 09/26/17.  The number to contact patient is [phone]

## 2017-09-20 NOTE — Telephone Encounter (Signed)
I'm fine with him going back part time and I'm fine with him waiting until his appointment next week.  Which ever he wants.

## 2017-09-21 ENCOUNTER — Telehealth (INDEPENDENT_AMBULATORY_CARE_PROVIDER_SITE_OTHER): Payer: Self-pay | Admitting: Orthopaedic Surgery

## 2017-09-21 ENCOUNTER — Encounter (INDEPENDENT_AMBULATORY_CARE_PROVIDER_SITE_OTHER): Payer: Self-pay

## 2017-09-21 NOTE — Telephone Encounter (Signed)
Patient aware note faxed to provided number [phone]

## 2017-09-21 NOTE — Telephone Encounter (Signed)
I faxed to number provided.

## 2017-09-21 NOTE — Telephone Encounter (Signed)
Patient called back with the correct fax number to his employer. [phone] Atn: Zella Ballobin

## 2017-09-26 ENCOUNTER — Ambulatory Visit (INDEPENDENT_AMBULATORY_CARE_PROVIDER_SITE_OTHER): Payer: Commercial Managed Care - PPO

## 2017-09-26 ENCOUNTER — Ambulatory Visit (INDEPENDENT_AMBULATORY_CARE_PROVIDER_SITE_OTHER): Payer: Commercial Managed Care - PPO | Admitting: Orthopaedic Surgery

## 2017-09-26 ENCOUNTER — Encounter (INDEPENDENT_AMBULATORY_CARE_PROVIDER_SITE_OTHER): Payer: Self-pay | Admitting: Orthopaedic Surgery

## 2017-09-26 DIAGNOSIS — Z96651 Presence of right artificial knee joint: Secondary | ICD-10-CM

## 2017-09-26 MED ORDER — DICLOFENAC SODIUM 1 % TD GEL: 4.0000 g | Freq: Four times a day (QID) | TRANSDERMAL | 2 refills | Status: DC

## 2017-09-26 NOTE — Progress Notes (Signed)
Office Visit Note   Patient: Joshua [name]           Date of Birth: 09/25/1970           MRN: [id number] Visit Date: 09/26/2017              Requested by: Collene Mareshyne, Sarah, NP [address] [place], [place] [id number] PCP: Collene Mareshyne, Sarah, NP   Assessment & Plan: Visit Diagnoses:  1. Status post total right knee replacement     Plan: Left start using Voltaren gel 4 g 4 times daily.  He will continue to work on tissue massage icing range of motion and strengthening the knee.  Place him out of work until Monday.  Then will go back part-time for 2 weeks after that 2 weeks return to work full duties.  See him back in a month check his progress lack of.  Questions encouraged and answered reassurance given.  Follow-Up Instructions: Return in about 1 month (around 10/24/2017).   Orders:  Orders Placed This Encounter  Procedures  . XR Knee 1-2 Views Right   Meds ordered this encounter  Medications  . diclofenac sodium (VOLTAREN) 1 % GEL    Sig: Apply 4 g topically 4 (four) times daily. Apply to right knee.    Dispense:  1 Tube    Refill:  2      Procedures: No procedures performed   Clinical Data: No additional findings.   Subjective: Chief Complaint  Patient presents with  . Right Knee - Routine Post Op    07/03/17 total right knee arthroplasty    HPI Joshua [name] returns now 85 days status post right total knee arthroplasty.  He states he is having pain lateral aspect of the knee and grinding sensation.  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  He is taking some oxycodone and also ibuprofen.  Ibuprofen is upsetting his stomach.  Said no fevers chills shortness of breath chest pain. Review of Systems Please see HPI otherwise negative  Objective: Vital Signs: There were no vitals taken for this visit.  Physical Exam  Constitutional: He is  oriented to person, place, and time. He appears well-developed and well-nourished. No distress.  Pulmonary/Chest: Effort normal.  Neurological: He is alert and oriented to person, place, and time.  Skin: He is not diaphoretic.    Ortho Exam Right knee has full extension flexion to 110 to 115 degrees.  No instability valgus varus stressing.  He has tenderness over the proximal lateral aspect of the knee.  No abnormal warmth erythema surgical incisions well-healed.  Has a slight effusion.  Specialty Comments:  No specialty comments available.  Imaging: Xr Knee 1-2 Views Right  Result Date: 09/26/2017 Right knee AP lateral views: Well-seated right knee components without any signs of loosening or failure.  No acute fractures.  No bony abnormalities otherwise.    PMFS History: Patient Active Problem List   Diagnosis Date Noted  . Status post total right knee replacement 07/03/2017  . Unilateral primary osteoarthritis, right knee 06/04/2017  . Right knee pain 06/04/2017  . Unilateral primary osteoarthritis, left knee 10/31/2016  . Status post total left knee replacement 10/31/2016  . SLEEP APNEA, OBSTRUCTIVE 12/16/2008  . HYPERTENSION 12/16/2008  . BACK PAIN, LUMBAR, CHRONIC 12/16/2008  . DEEP VENOUS THROMBOPHLEBITIS, HX OF 12/16/2008  .  MIGRAINES, HX OF 12/16/2008  . Other postprocedural status(V45.89) 02/28/2008   Past Medical History:  Diagnosis Date  . Arthritis    "wrists, knees, back, shoulders; everywhere" (11/01/2016)  . Chronic lower back pain   . Daily headache    "recently; related to pain in my left knee" (11/01/2016)  . 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 History of kidney stones    last flare up 2017  . HTN (hypertension)   . Hypertension   . Migraine    "might get 1/month now" (11/01/2016)  . OA (osteoarthritis) of knee    unilateral primary OA of left knee  . Obesity   . OSA (obstructive sleep apnea)    "don't wear the mask" (11/01/2016)    . Personal history of thrombophlebitis    back in 2016    Family History  Problem Relation Age of Onset  . Coronary artery disease Mother        premature  . Heart disease Father     Past Surgical History:  Procedure Laterality Date  . BACK SURGERY  1987/2005/2009; 03/25/2010; 2012 X 2; ?other dates   "I've had 10 total"  . CARPAL TUNNEL RELEASE Bilateral   . FOREARM SURGERY Right 2010   blood clots from my wrist to elbow" (11/01/2016)  . HAND SURGERY Right 2010   "aneurysm in my palm"  . INCISION AND DRAINAGE OF WOUND     lumbar  . JOINT REPLACEMENT    . LITHOTRIPSY  X 2  . LUMBAR DISC SURGERY  ~ 6  . LUMBAR SPINE HARDWARE REMOVAL  X 1  . POSTERIOR LUMBAR FUSION  X 2  . SHOULDER ARTHROSCOPY W/ ROTATOR CUFF REPAIR Right 2010  . TOTAL KNEE ARTHROPLASTY Left 10/31/2016   Procedure: LEFT TOTAL KNEE ARTHROPLASTY;  Surgeon: Kathryne Hitch, MD;  Location: [facility] OR;  Service: Orthopedics;  Laterality: Left;  . TOTAL KNEE ARTHROPLASTY Right 07/03/2017   Procedure: RIGHT TOTAL KNEE ARTHROPLASTY;  Surgeon: Kathryne Hitch, MD;  Location: [facility] OR;  Service: Orthopedics;  Laterality: Right;   Social History   Occupational History  . Occupation: Truck Air traffic controller: [employer]  Tobacco Use  . Smoking status: Never Smoker  . Smokeless tobacco: Never Used  Substance and Sexual Activity  . Alcohol use: No  . Drug use: No  . Sexual activity: Yes

## 2017-10-02 ENCOUNTER — Telehealth (INDEPENDENT_AMBULATORY_CARE_PROVIDER_SITE_OTHER): Payer: Self-pay | Admitting: Orthopaedic Surgery

## 2017-10-02 ENCOUNTER — Encounter (INDEPENDENT_AMBULATORY_CARE_PROVIDER_SITE_OTHER): Payer: Self-pay

## 2017-10-02 NOTE — Telephone Encounter (Signed)
Patient called asked if he can remain out of work until after 10/31/17. Patient said there is still a lot of fluid on his right knee. Patient asked if the note can be faxed to [facility]   Fax# is [phone]  The number to contact patient is [phone]

## 2017-10-02 NOTE — Telephone Encounter (Signed)
Faxed to provided number  

## 2017-11-05 ENCOUNTER — Ambulatory Visit (INDEPENDENT_AMBULATORY_CARE_PROVIDER_SITE_OTHER): Payer: Commercial Managed Care - PPO | Admitting: Orthopaedic Surgery

## 2017-12-28 ENCOUNTER — Other Ambulatory Visit (INDEPENDENT_AMBULATORY_CARE_PROVIDER_SITE_OTHER): Payer: Self-pay

## 2017-12-28 ENCOUNTER — Telehealth (INDEPENDENT_AMBULATORY_CARE_PROVIDER_SITE_OTHER): Payer: Self-pay | Admitting: Orthopaedic Surgery

## 2017-12-28 MED ORDER — ACETAMINOPHEN-CODEINE #3 300-30 MG PO TABS: 1.0000 | ORAL_TABLET | Freq: Three times a day (TID) | ORAL | 0 refills | Status: DC | PRN

## 2017-12-28 NOTE — Telephone Encounter (Signed)
Called into pharmacy for patient  Patient aware

## 2017-12-28 NOTE — Telephone Encounter (Signed)
He is almost 6 months now from his surgeyr, so we can not provide oxycodone at this point.  Only tylenol #3 or tramadol.  Which ever he wants would be 1-2 every 8 hours as needed for pain, #50, no refills...thanks

## 2017-12-28 NOTE — Telephone Encounter (Signed)
Please advise 

## 2017-12-28 NOTE — Telephone Encounter (Signed)
Patient requesting rx refill on oxycodone. # [phone]

## 2019-01-27 DIAGNOSIS — Z7901 Long term (current) use of anticoagulants: Secondary | ICD-10-CM | POA: Insufficient documentation

## 2019-09-29 DIAGNOSIS — R079 Chest pain, unspecified: Secondary | ICD-10-CM | POA: Diagnosis not present

## 2019-09-29 DIAGNOSIS — Z86711 Personal history of pulmonary embolism: Secondary | ICD-10-CM | POA: Diagnosis not present

## 2019-09-29 DIAGNOSIS — I10 Essential (primary) hypertension: Secondary | ICD-10-CM | POA: Diagnosis not present

## 2019-09-30 DIAGNOSIS — R079 Chest pain, unspecified: Secondary | ICD-10-CM | POA: Diagnosis not present

## 2019-09-30 DIAGNOSIS — Z86711 Personal history of pulmonary embolism: Secondary | ICD-10-CM | POA: Diagnosis not present

## 2019-09-30 DIAGNOSIS — I10 Essential (primary) hypertension: Secondary | ICD-10-CM | POA: Diagnosis not present

## 2019-10-13 ENCOUNTER — Telehealth: Payer: Self-pay | Admitting: Orthopaedic Surgery

## 2019-10-13 ENCOUNTER — Other Ambulatory Visit: Payer: Self-pay

## 2019-10-13 ENCOUNTER — Ambulatory Visit: Payer: Commercial Managed Care - PPO | Admitting: Orthopaedic Surgery

## 2019-10-13 ENCOUNTER — Ambulatory Visit (INDEPENDENT_AMBULATORY_CARE_PROVIDER_SITE_OTHER): Payer: Worker's Compensation

## 2019-10-13 ENCOUNTER — Ambulatory Visit (INDEPENDENT_AMBULATORY_CARE_PROVIDER_SITE_OTHER): Payer: Worker's Compensation | Admitting: Orthopaedic Surgery

## 2019-10-13 DIAGNOSIS — M25551 Pain in right hip: Secondary | ICD-10-CM

## 2019-10-13 DIAGNOSIS — M25561 Pain in right knee: Secondary | ICD-10-CM

## 2019-10-13 MED ORDER — METHYLPREDNISOLONE 4 MG PO TABS: ORAL_TABLET | ORAL | 0 refills | Status: DC

## 2019-10-13 MED ORDER — HYDROCODONE-ACETAMINOPHEN 5-325 MG PO TABS: 1.0000 | ORAL_TABLET | Freq: Four times a day (QID) | ORAL | 0 refills | Status: DC | PRN

## 2019-10-13 NOTE — Progress Notes (Signed)
Office Visit Note   Patient: Joshua [name]           Date of Birth: January 12, 1971           MRN: [id number] Visit Date: 10/13/2019              Requested by: Elenore Paddy, NP [place],   [address] PCP: Elenore Paddy, NP   Assessment & Plan: Visit Diagnoses:  1. Right knee pain, unspecified chronicity   2. Pain in right hip     Plan: I would like to keep him out of work and off of his knee and hip for the next 2 weeks to allow this to heal appropriately.  I am going to put him on a 6-day steroid taper and some hydrocodone.  I would like to reevaluate him with a repeat exam of especially the right knee in 2 weeks from now.  All questions and concerns were answered and addressed.  I gave him a note also to keep him out of work.  Follow-Up Instructions: Return in about 2 weeks (around 10/27/2019).   Orders:  Orders Placed This Encounter  Procedures  . XR Knee 1-2 Views Right  . XR HIP UNILAT W OR W/O PELVIS 1V RIGHT   Meds ordered this encounter  Medications  . methylPREDNISolone (MEDROL) 4 MG tablet    Sig: Medrol dose pack. Take as instructed    Dispense:  21 tablet    Refill:  0  . HYDROcodone-acetaminophen (NORCO/VICODIN) 5-325 MG tablet    Sig: Take 1-2 tablets by mouth every 6 (six) hours as needed for moderate pain.    Dispense:  30 tablet    Refill:  0      Procedures: No procedures performed   Clinical Data: No additional findings.   Subjective: Chief Complaint  Patient presents with  . Right Knee - Pain  Patient is well-known to me.  We replaced both his knees with the more recent one being his right knee in 2019.  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  This was a work-related accident.  He has quite a bit of right knee swelling and pain and hip pain.  He is walking with a limp and having difficulty sleeping at night due to the pain in his right hip and  right knee from this acute fall.  He cannot take anti-inflammatories due to being on blood thinning medication.  He has had no other acute changes in medical status.  HPI  Review of Systems He currently denies any headache, chest pain, shortness of breath, fever, chills, nausea, vomiting  Objective: Vital Signs: There were no vitals taken for this visit.  Physical Exam He is alert and oriented x3 and in no acute distress.  He is walking with a limp favoring the right side. Ortho Exam Examination of both his knees together shows well-healed surgical incisions.  Both knees have had knee replacements.  His right knee has full flexion and extension and he can hold his knee extended.  However it is swollen and slightly warm comparing to knees.  It is not red.  He has pain to palpation along the lateral IT band but there is no ligamentous instability of the knee.  He has full motion of his right hip with significant pain over the right hip trochanteric area. Specialty Comments:  No specialty comments available.  Imaging: XR HIP UNILAT W OR W/O PELVIS 1V RIGHT  Result Date: 10/13/2019 A low AP pelvis and lateral the right hip shows no acute findings.  The hip joint space is well-maintained.  XR Knee 1-2 Views Right  Result Date: 10/13/2019 Standing AP showing both knees shows bilateral total knee arthroplasties with no complicating feature.  The lateral of the right knee shows a total knee arthroplasty with no complicating features.  There is otherwise no acute findings.    PMFS History: Patient Active Problem List   Diagnosis Date Noted  . Status post total right knee replacement 07/03/2017  . Unilateral primary osteoarthritis, right knee 06/04/2017  . Right knee pain 06/04/2017  . Unilateral primary osteoarthritis, left knee 10/31/2016  . Status post total left knee replacement 10/31/2016  . SLEEP APNEA, OBSTRUCTIVE 12/16/2008  . HYPERTENSION 12/16/2008  . BACK PAIN, LUMBAR, CHRONIC  12/16/2008  . DEEP VENOUS THROMBOPHLEBITIS, HX OF 12/16/2008  . MIGRAINES, HX OF 12/16/2008  . Other postprocedural status(V45.89) 02/28/2008   Past Medical History:  Diagnosis Date  . Arthritis    "wrists, knees, back, shoulders; everywhere" (11/01/2016)  . Chronic lower back pain   . Daily headache    "recently; related to pain in my left knee" (11/01/2016)  . 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 History of kidney stones    last flare up 2017  . HTN (hypertension)   . Hypertension   . Migraine    "might get 1/month now" (11/01/2016)  . OA (osteoarthritis) of knee    unilateral primary OA of left knee  . Obesity   . OSA (obstructive sleep apnea)    "don't wear the mask" (11/01/2016)  . Personal history of thrombophlebitis    back in 2016    Family History  Problem Relation Age of Onset  . Coronary artery disease Mother        premature  . Heart disease Father     Past Surgical History:  Procedure Laterality Date  . BACK SURGERY  1987/2005/2009; 03/25/2010; 2012 X 2; ?other dates   "I've had 10 total"  . CARPAL TUNNEL RELEASE Bilateral   . FOREARM SURGERY Right 2010   blood clots from my wrist to elbow" (11/01/2016)  . HAND SURGERY Right 2010   "aneurysm in my palm"  . INCISION AND DRAINAGE OF WOUND     lumbar  . JOINT REPLACEMENT    . LITHOTRIPSY  X 2  . LUMBAR DISC SURGERY  ~ 6  . LUMBAR SPINE HARDWARE REMOVAL  X 1  . POSTERIOR LUMBAR FUSION  X 2  . SHOULDER ARTHROSCOPY W/ ROTATOR CUFF REPAIR Right 2010  . TOTAL KNEE ARTHROPLASTY Left 10/31/2016   Procedure: LEFT TOTAL KNEE ARTHROPLASTY;  Surgeon: Kathryne Hitch, MD;  Location: [facility] OR;  Service: Orthopedics;  Laterality: Left;  . TOTAL KNEE ARTHROPLASTY Right 07/03/2017   Procedure: RIGHT TOTAL KNEE ARTHROPLASTY;  Surgeon: Kathryne Hitch, MD;  Location: [facility] OR;  Service: Orthopedics;  Laterality: Right;   Social History   Occupational History  . Occupation: Truck Clinical research associate: [employer]  Tobacco Use  . Smoking status: Never Smoker  . Smokeless tobacco: Never Used  Vaping Use  . Vaping Use: Never used  Substance and Sexual Activity  . Alcohol use: No  . Drug use: No  . Sexual activity: Yes

## 2019-10-13 NOTE — Telephone Encounter (Signed)
Called patient left message to return call concerning W/C information needed from his employer

## 2019-10-14 ENCOUNTER — Telehealth: Payer: Self-pay | Admitting: Orthopaedic Surgery

## 2019-10-14 NOTE — Telephone Encounter (Signed)
Patient called advised he uses the pharmacy on [address] in  [place]. Patient said Rx is not showing received at the pharmacy yet. Patient said there were two Rx that were suppose to be called in. The number to contact patient is [phone]

## 2019-10-14 NOTE — Telephone Encounter (Signed)
LMOM for patient letting him know Rx;s are actually ready at that pharmacy

## 2019-10-20 ENCOUNTER — Telehealth: Payer: Self-pay | Admitting: Orthopaedic Surgery

## 2019-10-20 MED ORDER — TIZANIDINE HCL 4 MG PO TABS: 4.0000 mg | ORAL_TABLET | Freq: Three times a day (TID) | ORAL | 0 refills | Status: DC | PRN

## 2019-10-20 MED ORDER — TRAMADOL HCL 50 MG PO TABS
50.0000 mg | ORAL_TABLET | Freq: Four times a day (QID) | ORAL | 0 refills | Status: DC | PRN
Start: 2019-10-20 — End: 2021-07-29

## 2019-10-20 NOTE — Telephone Encounter (Signed)
LMOM for patient of the below

## 2019-10-20 NOTE — Telephone Encounter (Signed)
He needs to stop the hydrocodone.  I sent in some tramadol and some Zanaflex to try instead.

## 2019-10-20 NOTE — Telephone Encounter (Signed)
Patient called requesting a new medication. Patient states the hydrocodone he believes is bothering his stomach. Please give patient a call back this matter. Patient phone number is [phone].

## 2019-10-20 NOTE — Telephone Encounter (Signed)
Please advise 

## 2019-10-29 ENCOUNTER — Ambulatory Visit (INDEPENDENT_AMBULATORY_CARE_PROVIDER_SITE_OTHER): Payer: Worker's Compensation | Admitting: Orthopaedic Surgery

## 2019-10-29 ENCOUNTER — Other Ambulatory Visit: Payer: Self-pay

## 2019-10-29 ENCOUNTER — Encounter: Payer: Self-pay | Admitting: Orthopaedic Surgery

## 2019-10-29 DIAGNOSIS — M25561 Pain in right knee: Secondary | ICD-10-CM

## 2019-10-29 DIAGNOSIS — Z96651 Presence of right artificial knee joint: Secondary | ICD-10-CM | POA: Diagnosis not present

## 2019-10-29 DIAGNOSIS — M25551 Pain in right hip: Secondary | ICD-10-CM | POA: Diagnosis not present

## 2019-10-29 NOTE — Progress Notes (Signed)
Patient comes in today for continued treatment of his right knee and right hip.  He injured these in a work-related fall a few weeks ago.  The right knee has knee replacement.  His pain is still been consistent along the lateral aspect of the knee has been having some locking symptoms.  I did x-ray the knee replacement 2 weeks ago and it was stable.  He is here for repeat exam today.  I had him out of work.  He states that the right hip is become manageable but the right knee is still symptomatic in terms of the locking symptoms.  On examination of his right knee it is not swollen like it was before.  His pain is consistent along the lateral collateral ligament and distal IT band.  It is ligamentously stable and comparable to his left knee that has a total knee arthroplasty.  His right hip exam is almost normal.  I do feel he is at least strained/sprained or even partially tore the lateral collateral ligament but this should heal completely with time.  Letter treat him in a hinged knee brace and have him out of work the next 4 weeks so we can strengthen the knee and allow this to appropriately heal so he can get back to his heavy duty type work.  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  All questions and concerns were answered and addressed.  I gave him a note reflecting his work status and will reevaluate him in 4 weeks.

## 2019-11-26 ENCOUNTER — Encounter: Payer: Self-pay | Admitting: Orthopaedic Surgery

## 2019-11-26 ENCOUNTER — Ambulatory Visit (INDEPENDENT_AMBULATORY_CARE_PROVIDER_SITE_OTHER): Payer: Worker's Compensation | Admitting: Orthopaedic Surgery

## 2019-11-26 DIAGNOSIS — M25551 Pain in right hip: Secondary | ICD-10-CM

## 2019-11-26 DIAGNOSIS — M25561 Pain in right knee: Secondary | ICD-10-CM | POA: Diagnosis not present

## 2019-11-26 NOTE — Progress Notes (Signed)
HPI: Mr. Enberg returns today follow-up of his right knee and hip.  He states the hip is bearable his main complaint at this point in time his right knee.  He states since he was last seen he is fallen again due to the right knee giving way on him.  He was in the brace.  He feels his knee feels weak and gives way often but only had this 1 fall since last seen.  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  He notes the knee continues to swell at times.  He has had no shortness of breath calf pain.  Review of systems: Please see HPI otherwise negative noncontributory.  Right knee has tenderness over the lateral IT band at the knee.  No instability valgus varus stressing but he does have pain with valgus stressing of the right knee.  Surgical incisions well-healed.  Full extension flexion with some discomfort.  Anterior drawer is negative.  No significant effusion abnormal warmth erythema or edema.  Right calf supple nontender.   Impression: Right knee pain acute Right hip pain resolving  Plan: Due to the fact the patient is now almost 2 months status post injury and continues to have pain and mechanical symptoms of the knee recommend MRI of the right knee to rule out occult fracture, ligamentous injury or hardware loosening.  He will remain out of work until follow-up.  We will see him after the MRI to go over the results and discuss further treatment.  At that time reevaluate his work status.  He will continue the hinged knee brace.  Questions were encouraged and answered.

## 2019-11-27 ENCOUNTER — Other Ambulatory Visit: Payer: Self-pay

## 2019-11-27 DIAGNOSIS — M25561 Pain in right knee: Secondary | ICD-10-CM

## 2019-12-04 ENCOUNTER — Other Ambulatory Visit: Payer: Commercial Managed Care - PPO

## 2019-12-10 ENCOUNTER — Ambulatory Visit
Admission: RE | Admit: 2019-12-10 | Discharge: 2019-12-10 | Disposition: A | Discharge status: Home / Self Care | Payer: Worker's Compensation | Source: Ambulatory Visit | Attending: Orthopaedic Surgery | Admitting: Orthopaedic Surgery

## 2019-12-10 ENCOUNTER — Other Ambulatory Visit: Payer: Self-pay

## 2019-12-10 DIAGNOSIS — M25561 Pain in right knee: Secondary | ICD-10-CM

## 2019-12-16 ENCOUNTER — Other Ambulatory Visit: Payer: Commercial Managed Care - PPO

## 2019-12-20 ENCOUNTER — Other Ambulatory Visit: Payer: Commercial Managed Care - PPO

## 2019-12-23 ENCOUNTER — Other Ambulatory Visit: Payer: Self-pay

## 2019-12-23 ENCOUNTER — Ambulatory Visit (INDEPENDENT_AMBULATORY_CARE_PROVIDER_SITE_OTHER): Payer: Worker's Compensation | Admitting: Orthopaedic Surgery

## 2019-12-23 ENCOUNTER — Encounter: Payer: Self-pay | Admitting: Orthopaedic Surgery

## 2019-12-23 DIAGNOSIS — M25561 Pain in right knee: Secondary | ICD-10-CM | POA: Diagnosis not present

## 2019-12-23 DIAGNOSIS — Z96651 Presence of right artificial knee joint: Secondary | ICD-10-CM | POA: Diagnosis not present

## 2019-12-23 NOTE — Progress Notes (Signed)
The patient comes in to go over an MRI of his right knee.  He has a history of a right total knee arthroplasty and has done very well until a significant accident and injury involving the right lower extremity.  Seem like it was between the thigh and the knee but now is gone down to the knee more.  The upper hip area feels better and we have worked on just rest and activity modification.  He had a knee brace as well.  After continue pain and x-ray showing no obvious abnormalities, I felt was prudent to obtain an MRI of the right lower extremity to rule out any type of stress fracture.  The MRI is reviewed and it does not show any acute abnormalities the total knee replacement.  There is no fluid collection and no evidence of fracture.  There is no evidence of hardware failure or loosening of the hardware.  There is some atrophy of the hamstrings on the right side.  On examination of his right knee today it is ligamentously stable with full range of motion.  It seems to hurt more laterally but there is no instability on exam at all and there is no significant knee effusion.  At this point I would like to send him to outpatient physical therapy to work on any modalities to strengthen the right lower extremity and decrease his pain.  We will continue to keep him out of work for the next 4 weeks as he hopefully rehabilitates this knee and hopefully can get it feeling better.  All question concerns were answered and addressed.  We will reevaluate him in 4 weeks.

## 2019-12-27 ENCOUNTER — Other Ambulatory Visit: Payer: Commercial Managed Care - PPO

## 2019-12-30 ENCOUNTER — Telehealth: Payer: Self-pay

## 2019-12-30 NOTE — Telephone Encounter (Signed)
Joshua [name] with [facility].would like last office note faxed to [phone].  Cb# [phone].  Please advise.  Thank you.

## 2019-12-30 NOTE — Telephone Encounter (Signed)
Faxed to provided number  

## 2020-01-20 ENCOUNTER — Ambulatory Visit (INDEPENDENT_AMBULATORY_CARE_PROVIDER_SITE_OTHER): Payer: Worker's Compensation | Admitting: Orthopaedic Surgery

## 2020-01-20 ENCOUNTER — Encounter: Payer: Self-pay | Admitting: Orthopaedic Surgery

## 2020-01-20 DIAGNOSIS — M25561 Pain in right knee: Secondary | ICD-10-CM

## 2020-01-20 MED ORDER — LIDOCAINE HCL 1 % IJ SOLN
1.0000 mL | INTRAMUSCULAR | Status: AC | PRN
  Administered 2020-01-20: 1 mL

## 2020-01-20 MED ORDER — METHYLPREDNISOLONE ACETATE 40 MG/ML IJ SUSP
40.0000 mg | INTRAMUSCULAR | Status: AC | PRN
Start: 2020-01-20 — End: 2020-01-20
  Administered 2020-01-20: 40 mg via INTRAMUSCULAR

## 2020-01-20 NOTE — Progress Notes (Signed)
Office Visit Note   Patient: Joshua [name]           Date of Birth: Feb 18, 1971           MRN: [id number] Visit Date: 01/20/2020              Requested by: Joshua Handler, NP [address] [place],  [place] [address] PCP: Joshua Handler, NP   Assessment & Plan: Visit Diagnoses:  1. Acute pain of right knee     Plan:  He will stop formal physical therapy.  Did discuss some IT band stretching exercises and hamstring exercises he can try on his own.  We'll see him back in a month to see how he did with the trigger point injection lateral aspect right knee.  He'll return to work 02/02/2020 full duties.  Follow-Up Instructions: Return in about 4 weeks (around 02/17/2020).   Orders:  Orders Placed This Encounter  Procedures  . Trigger Point Inj   No orders of the defined types were placed in this encounter.     Procedures: Trigger Point Inj  Date/Time: 01/20/2020 9:07 AM Performed by: Joshua Bouchard, PA-C Authorized by: Joshua Bouchard, PA-C   Consent Given by:  Patient Site marked: the procedure site was marked   Timeout: prior to procedure the correct patient, procedure, and site was verified   Indications:  Pain Total # of Trigger Points:  1 Location: lower extremity   Needle Size:  25 G Approach:  Lateral Medications #1:  1 mL lidocaine 1 %; 40 mg methylPREDNISolone acetate 40 MG/ML     Clinical Data: No additional findings.   Subjective: Chief Complaint  Patient presents with  . Right Leg - Follow-up    HPI Joshua [name] returns today follow-up of his right knee pain has been ongoing since June work-related incident He had a fall from a dump truck.  He has been going to formal physical therapy and states that the therapy aggravates his low back which he has had some 10 surgeries by Joshua [name].  He is denies any numbness tingling down either leg.  Still having pain in the lateral aspect of the right knee.  He is unable to take NSAIDs due to the fact that he is on  Xarelto.  Review of Systems See HPI  Objective: Vital Signs: There were no vitals taken for this visit.  Physical Exam Constitutional:      Appearance: He is not ill-appearing or diaphoretic.  Pulmonary:     Effort: Pulmonary effort is normal.  Neurological:     Mental Status: He is alert and oriented to person, place, and time.  Psychiatric:        Mood and Affect: Mood normal.     Ortho Exam Bilateral hips full range of motion without pain.  He is nontender over the trochanteric region of bilateral hips.  5 out of 5 strength throughout the lower extremities against resistance.  Exquisitely tight hamstrings bilaterally.  Sensation grossly intact bilateral feet to light touch.  Right knee excellent range of motion.  No instability valgus varus stressing.  No abnormal warmth erythema or effusion of the right knee.  Tenderness over the lateral knee just anterior to the right IT band insertion. Specialty Comments:  No specialty comments available.  Imaging: No results found.   PMFS History: Patient Active Problem List   Diagnosis Date Noted  . Status post total right knee replacement 07/03/2017  . Unilateral primary osteoarthritis, right knee  06/04/2017  . Right knee pain 06/04/2017  . Unilateral primary osteoarthritis, left knee 10/31/2016  . Status post total left knee replacement 10/31/2016  . SLEEP APNEA, OBSTRUCTIVE 12/16/2008  . HYPERTENSION 12/16/2008  . BACK PAIN, LUMBAR, CHRONIC 12/16/2008  . DEEP VENOUS THROMBOPHLEBITIS, HX OF 12/16/2008  . MIGRAINES, HX OF 12/16/2008  . Other postprocedural status(V45.89) 02/28/2008   Past Medical History:  Diagnosis Date  . Arthritis    "wrists, knees, back, shoulders; everywhere" (11/01/2016)  . Chronic lower back pain   . Daily headache    "recently; related to pain in my left knee" (11/01/2016)  . 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 History of kidney stones    last flare up 2017  . HTN (hypertension)    . Hypertension   . Migraine    "might get 1/month now" (11/01/2016)  . OA (osteoarthritis) of knee    unilateral primary OA of left knee  . Obesity   . OSA (obstructive sleep apnea)    "don't wear the mask" (11/01/2016)  . Personal history of thrombophlebitis    back in 2016    Family History  Problem Relation Age of Onset  . Coronary artery disease Mother        premature  . Heart disease Father     Past Surgical History:  Procedure Laterality Date  . BACK SURGERY  1987/2005/2009; 03/25/2010; 2012 X 2; ?other dates   "I've had 10 total"  . CARPAL TUNNEL RELEASE Bilateral   . FOREARM SURGERY Right 2010   blood clots from my wrist to elbow" (11/01/2016)  . HAND SURGERY Right 2010   "aneurysm in my palm"  . INCISION AND DRAINAGE OF WOUND     lumbar  . JOINT REPLACEMENT    . LITHOTRIPSY  X 2  . LUMBAR DISC SURGERY  ~ 6  . LUMBAR SPINE HARDWARE REMOVAL  X 1  . POSTERIOR LUMBAR FUSION  X 2  . SHOULDER ARTHROSCOPY W/ ROTATOR CUFF REPAIR Right 2010  . TOTAL KNEE ARTHROPLASTY Left 10/31/2016   Procedure: LEFT TOTAL KNEE ARTHROPLASTY;  Surgeon: Joshua Hitch, MD;  Location: [facility] OR;  Service: Orthopedics;  Laterality: Left;  . TOTAL KNEE ARTHROPLASTY Right 07/03/2017   Procedure: RIGHT TOTAL KNEE ARTHROPLASTY;  Surgeon: Joshua Hitch, MD;  Location: [facility] OR;  Service: Orthopedics;  Laterality: Right;   Social History   Occupational History  . Occupation: Truck Air traffic controller: [employer]  Tobacco Use  . Smoking status: Never Smoker  . Smokeless tobacco: Never Used  Vaping Use  . Vaping Use: Never used  Substance and Sexual Activity  . Alcohol use: No  . Drug use: No  . Sexual activity: Yes

## 2020-01-29 ENCOUNTER — Telehealth: Payer: Self-pay | Admitting: Orthopaedic Surgery

## 2020-01-29 NOTE — Telephone Encounter (Signed)
Please advise 

## 2020-01-29 NOTE — Telephone Encounter (Signed)
Patient called needing Rx refilled Percocet. The number to contact patient is [phone]

## 2020-02-23 ENCOUNTER — Ambulatory Visit (INDEPENDENT_AMBULATORY_CARE_PROVIDER_SITE_OTHER): Payer: Worker's Compensation | Admitting: Orthopaedic Surgery

## 2020-02-23 ENCOUNTER — Encounter: Payer: Self-pay | Admitting: Orthopaedic Surgery

## 2020-02-23 ENCOUNTER — Other Ambulatory Visit: Payer: Self-pay

## 2020-02-23 DIAGNOSIS — Z96651 Presence of right artificial knee joint: Secondary | ICD-10-CM | POA: Diagnosis not present

## 2020-02-23 DIAGNOSIS — M25561 Pain in right knee: Secondary | ICD-10-CM

## 2020-02-23 DIAGNOSIS — G8929 Other chronic pain: Secondary | ICD-10-CM

## 2020-02-23 NOTE — Progress Notes (Signed)
The patient is well-known to [place].  He had a right total knee arthroplasty performed for osteoarthritis in March 2019.  He had done very well until early June of this year when he sustained mechanical fall about 4 feet out of a truck I believe.  This was a work-related.  Since then he had significant episodes of right knee pain.  A MRI earlier this year after his accident did not show any thing torn within the knee or any worrisome findings on the MRI.  We treated him with rest and activity modification as well as physical therapy.  He has tried knee sleeves and braces.  His last visit showed some lateral tenderness so we did provide a trigger point injection around the IT band.  He said that worked good bit recently has had some episodes where the knee actually gave out on him completely dropped.  Examination of the right knee today shows no effusion.  The knee joint itself is cool.  He has good range of motion of the knee and on my exam it feels ligamentously stable.  At this point we will obtain a three-phase bone scan to rule out any stress fracture or prosthetic loosening.  I am not sure what else to recommend at this standpoint other than if he continues to have symptoms of instability that we consider opening his knee up to assess for polyliner wear with the need to upsize poly if ligaments are stretched.

## 2020-02-25 ENCOUNTER — Telehealth: Payer: Self-pay | Admitting: Orthopaedic Surgery

## 2020-02-25 ENCOUNTER — Telehealth: Payer: Self-pay

## 2020-02-25 NOTE — Telephone Encounter (Signed)
Patient called in wanting note from recent visit to take to his employer. Says he would like for it to be emailed .  [email or web address]

## 2020-02-25 NOTE — Telephone Encounter (Signed)
Could you email him his office note?

## 2020-02-25 NOTE — Telephone Encounter (Signed)
Sorry about that, it's in there now

## 2020-02-25 NOTE — Telephone Encounter (Signed)
Patient just needs note that he was seen. Will you compose and then I will email to him. Thanks!

## 2020-02-25 NOTE — Telephone Encounter (Signed)
Patient called needing a note emailed to him for his employer. Patient said he saw Dr Magnus Ivan 02/23/2020. The email address is [email or web address]. The number to contact patient is [phone]

## 2020-02-25 NOTE — Telephone Encounter (Signed)
Note emailed to patient

## 2020-02-25 NOTE — Telephone Encounter (Signed)
Emailed patient Joshua [name] that needs to be completed before we can release his records.

## 2020-03-16 DIAGNOSIS — Z800 Family history of malignant neoplasm of digestive organs: Secondary | ICD-10-CM | POA: Insufficient documentation

## 2020-03-16 DIAGNOSIS — Z1159 Encounter for screening for other viral diseases: Secondary | ICD-10-CM | POA: Insufficient documentation

## 2020-03-16 DIAGNOSIS — Z6841 Body Mass Index (BMI) 40.0 and over, adult: Secondary | ICD-10-CM | POA: Insufficient documentation

## 2020-10-05 DIAGNOSIS — I517 Cardiomegaly: Secondary | ICD-10-CM | POA: Diagnosis not present

## 2020-10-06 DIAGNOSIS — R079 Chest pain, unspecified: Secondary | ICD-10-CM | POA: Diagnosis not present

## 2020-10-07 ENCOUNTER — Encounter: Payer: Self-pay | Admitting: Orthopaedic Surgery

## 2021-01-03 ENCOUNTER — Ambulatory Visit (INDEPENDENT_AMBULATORY_CARE_PROVIDER_SITE_OTHER): Payer: Self-pay

## 2021-01-03 ENCOUNTER — Other Ambulatory Visit: Payer: Self-pay

## 2021-01-03 ENCOUNTER — Ambulatory Visit (INDEPENDENT_AMBULATORY_CARE_PROVIDER_SITE_OTHER): Payer: 59 | Admitting: Orthopaedic Surgery

## 2021-01-03 VITALS — Ht 72.0 in | Wt 280.0 lb

## 2021-01-03 DIAGNOSIS — M25561 Pain in right knee: Secondary | ICD-10-CM

## 2021-01-03 DIAGNOSIS — M25551 Pain in right hip: Secondary | ICD-10-CM

## 2021-01-03 MED ORDER — METHYLPREDNISOLONE 4 MG PO TABS: ORAL_TABLET | ORAL | 0 refills | Status: DC

## 2021-01-03 NOTE — Progress Notes (Signed)
Office Visit Note   Patient: Joshua [name]           Date of Birth: 12-10-1970           MRN: [id number] Visit Date: 01/03/2021              Requested by: Julianne Handler, NP [address] [place],  [place] [address] PCP: Julianne Handler, NP   Assessment & Plan: Visit Diagnoses:  1. Acute pain of right knee   2. Pain in right hip     Plan: He is likely significantly strained the muscles in the area around the right hip.  I am going to try a 6-day steroid taper and have him try his Flexeril as well.  Hopefully this will improve about 6 to 8 weeks out from the injury.  I would like to see him back in 3 weeks because he is not getting much relief we may consider an MRI of the left hip to rule out a muscle tear.  Follow-Up Instructions: Return in about 3 weeks (around 01/24/2021).   Orders:  Orders Placed This Encounter  Procedures   XR Knee 1-2 Views Right   XR HIP UNILAT W OR W/O PELVIS 1V RIGHT   Meds ordered this encounter  Medications   methylPREDNISolone (MEDROL) 4 MG tablet    Sig: Medrol dose pack. Take as instructed    Dispense:  21 tablet    Refill:  0       Procedures: No procedures performed   Clinical Data: No additional findings.   Subjective: Chief Complaint  Patient presents with   Right Hip - Pain   Right Knee - Pain  The patient is a 50 year old gentleman well-known to [place].  We have asked replace his knees.  About 3 weeks ago he pulled a muscle in his right leg and he went to [facility] urgent care.  They told him there is no fractures.  He says sudden hurt with standing up and walking he gets pain on the lateral aspect of his right hip and over the iliac crest areas where he points to on the right side.  He does radiate down the side of his leg to his knee.  He denies any groin pain or any specific knee pain but it does hurt lay on that side at night and and he is just not getting any better.  They did give him some hydrocodone to try.  He is not getting rest  at night.  He does have cyclobenzaprine at home but has not used it or tried that.  He denies any radicular symptoms or sciatic symptoms.  He denies any change in bowel bladder function.  The pain does not radiate past the knee to his foot at all.  HPI  Review of Systems There is currently listed no headache, chest pain, shortness of breath, fever, chills, nausea, vomiting  Objective: Vital Signs: Ht 6' (1.829 m)   Wt 280 lb (127 kg)   BMI 37.97 kg/m   Physical Exam He is alert and orient x3 and in no acute distress Ortho Exam Examination of his right hip shows pain over the anterior superior iliac spine as well as the IT band at the hip and the knee.  His hip moves smoothly as well as his right operative knee.  The hip and knee are stable. Specialty Comments:  No specialty comments available.  Imaging: XR HIP UNILAT W OR W/O PELVIS 1V RIGHT  Result  Date: 01/03/2021 An AP pelvis and a lateral of the right hip shows normal-appearing hips bilaterally with no acute findings.  The hip joint space is maintained on both hips.  XR Knee 1-2 Views Right  Result Date: 01/03/2021 2 views of the right knee show well-seated total knee arthroplasty with no complicating features.    PMFS History: Patient Active Problem List   Diagnosis Date Noted   Status post total right knee replacement 07/03/2017   Unilateral primary osteoarthritis, right knee 06/04/2017   Right knee pain 06/04/2017   Unilateral primary osteoarthritis, left knee 10/31/2016   Status post total left knee replacement 10/31/2016   SLEEP APNEA, OBSTRUCTIVE 12/16/2008   HYPERTENSION 12/16/2008   BACK PAIN, LUMBAR, CHRONIC 12/16/2008   DEEP VENOUS THROMBOPHLEBITIS, HX OF 12/16/2008   MIGRAINES, HX OF 12/16/2008   Other postprocedural status(V45.89) 02/28/2008   Past Medical History:  Diagnosis Date   Arthritis    "wrists, knees, back, shoulders; everywhere" (11/01/2016)   Chronic lower back pain    Daily headache     "recently; related to pain in my left knee" (11/01/2016)   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)    History of kidney stones    last flare up 2017   HTN (hypertension)    Hypertension    Migraine    "might get 1/month now" (11/01/2016)   OA (osteoarthritis) of knee    unilateral primary OA of left knee   Obesity    OSA (obstructive sleep apnea)    "don't wear the mask" (11/01/2016)   Personal history of thrombophlebitis    back in 2016    Family History  Problem Relation Age of Onset   Coronary artery disease Mother        premature   Heart disease Father     Past Surgical History:  Procedure Laterality Date   BACK SURGERY  1987/2005/2009; 03/25/2010; 2012 X 2; ?other dates   "I've had 10 total"   CARPAL TUNNEL RELEASE Bilateral    FOREARM SURGERY Right 2010   blood clots from my wrist to elbow" (11/01/2016)   HAND SURGERY Right 2010   "aneurysm in my palm"   INCISION AND DRAINAGE OF WOUND     lumbar   JOINT REPLACEMENT     LITHOTRIPSY  X 2   LUMBAR DISC SURGERY  ~ 6   LUMBAR SPINE HARDWARE REMOVAL  X 1   POSTERIOR LUMBAR FUSION  X 2   SHOULDER ARTHROSCOPY W/ ROTATOR CUFF REPAIR Right 2010   TOTAL KNEE ARTHROPLASTY Left 10/31/2016   Procedure: LEFT TOTAL KNEE ARTHROPLASTY;  Surgeon: Kathryne Hitch, MD;  Location: [facility] OR;  Service: Orthopedics;  Laterality: Left;   TOTAL KNEE ARTHROPLASTY Right 07/03/2017   Procedure: RIGHT TOTAL KNEE ARTHROPLASTY;  Surgeon: Kathryne Hitch, MD;  Location: [facility] OR;  Service: Orthopedics;  Laterality: Right;   Social History   Occupational History   Occupation: Truck Air traffic controller: [employer]  Tobacco Use   Smoking status: Not on file   Smokeless tobacco: Never  Vaping Use   Vaping Use: Never used  Substance and Sexual Activity   Alcohol use: No   Drug use: No   Sexual activity: Yes

## 2021-01-24 ENCOUNTER — Encounter: Payer: Self-pay | Admitting: Orthopaedic Surgery

## 2021-01-24 ENCOUNTER — Ambulatory Visit (INDEPENDENT_AMBULATORY_CARE_PROVIDER_SITE_OTHER): Payer: Self-pay | Admitting: Orthopaedic Surgery

## 2021-01-24 ENCOUNTER — Other Ambulatory Visit: Payer: Self-pay

## 2021-01-24 DIAGNOSIS — M25551 Pain in right hip: Secondary | ICD-10-CM

## 2021-01-24 NOTE — Progress Notes (Signed)
The patient comes for follow-up to assess his right hip area and low back to the right side.  He had injured this several weeks ago and was having a lot of pain over the iliac crest area.  He said that is gotten much better.  He has had back surgery by one of the neurosurgeons in town.  He says overall he is feeling better.  He has bilateral knee replacements that I have done.  He says his right knee still pops him quite a bit.  On examination of his right knee there is no effusion.  He has full range of motion of the knee and is ligamentously stable.  I think he is feeling the components the flexion-extension arc but he does not feel unstable to me at all.  Previous x-rays from last month showed a normal seated implant with the right knee.  His right hip exam is normal today.  From our standpoint follow-up can be as needed since he is doing much better overall.  If he continues to have back issues he needs to see his back surgeon about

## 2021-02-17 ENCOUNTER — Other Ambulatory Visit: Payer: Self-pay | Admitting: Orthopaedic Surgery

## 2021-02-17 DIAGNOSIS — Z96651 Presence of right artificial knee joint: Secondary | ICD-10-CM

## 2021-07-29 ENCOUNTER — Encounter: Payer: Self-pay | Admitting: Cardiology

## 2021-07-29 ENCOUNTER — Ambulatory Visit: Payer: Managed Care, Other (non HMO) | Admitting: Cardiology

## 2021-07-29 VITALS — BP 140/86 | HR 94 | Ht 72.0 in | Wt 301.2 lb

## 2021-07-29 DIAGNOSIS — G4733 Obstructive sleep apnea (adult) (pediatric): Secondary | ICD-10-CM

## 2021-07-29 DIAGNOSIS — R079 Chest pain, unspecified: Secondary | ICD-10-CM

## 2021-07-29 DIAGNOSIS — R0789 Other chest pain: Secondary | ICD-10-CM | POA: Insufficient documentation

## 2021-07-29 DIAGNOSIS — E785 Hyperlipidemia, unspecified: Secondary | ICD-10-CM | POA: Diagnosis not present

## 2021-07-29 DIAGNOSIS — Z8672 Personal history of thrombophlebitis: Secondary | ICD-10-CM

## 2021-07-29 DIAGNOSIS — I119 Hypertensive heart disease without heart failure: Secondary | ICD-10-CM | POA: Diagnosis not present

## 2021-07-29 MED ORDER — METOPROLOL TARTRATE 100 MG PO TABS: 100.0000 mg | ORAL_TABLET | Freq: Once | ORAL | 0 refills | Status: DC

## 2021-07-29 NOTE — Patient Instructions (Signed)
Medication Instructions:  ?Your physician recommends that you continue on your current medications as directed. Please refer to the Current Medication list given to you today. ? ?*If you need a refill on your cardiac medications before your next appointment, please call your pharmacy* ? ? ?Lab Work: ? ?BMP 1 week before CT ? ?If you have labs (blood work) drawn today and your tests are completely normal, you will receive your results only by: ?MyChart Message (if you have MyChart) OR ?A paper copy in the mail ?If you have any lab test that is abnormal or we need to change your treatment, we will call you to review the results. ? ? ?Testing/Procedures: ?Your physician has requested that you have an echocardiogram. Echocardiography is a painless test that uses sound waves to create images of your heart. It provides your doctor with information about the size and shape of your heart and how well your heart?s chambers and valves are working. This procedure takes approximately one hour. There are no restrictions for this procedure. ? ? ? ?Your cardiac CT will be scheduled at one of the below locations:  ? ?[facility] ?[address] ?[place], [place] [address] ?[phone] ? ?OR ? ?[facility] ?[address] ?[address] ?[place], [place] [address] ?[phone] ? ?If scheduled at [facility], please arrive at the [facility] and Children's Entrance (Entrance C2) of [facility] 30 minutes prior to test start time. ?You can use the FREE valet parking offered at entrance C (encouraged to control the heart rate for the test)  ?Proceed to the [facility] Radiology Department (first floor) to check-in and test prep. ? ?All radiology patients and guests should use entrance C2 at [facility], accessed from [facility], even though the hospital's physical address listed is [address]. ? ? ? ?If scheduled at [facility], please arrive 15 mins early for check-in and test prep. ? ?Please follow these instructions carefully (unless otherwise directed): ? ?On the Night Before the Test: ?Be sure to Drink plenty of water. ?Do not consume any caffeinated/decaffeinated beverages or chocolate 12 hours prior to your test. ?Do not take any antihistamines 12 hours prior to your test. ? ?On the Day of the Test: ?Drink plenty of water until 1 hour prior to the test. ?Do not eat any food 4 hours prior to the test. ?You may take your regular medications prior to the test.  ?Take metoprolol (Lopressor) two hours prior to test. ?HOLD Diovan - HCTZ morning of the test. ?     ?After the Test: ?Drink plenty of water. ?After receiving IV contrast, you may experience a mild flushed feeling. This is normal. ?On occasion, you may experience a mild rash up to 24 hours after the test. This is not dangerous. If this occurs, you can take Benadryl 25 mg and increase your fluid intake. ?If you experience trouble breathing, this can be serious. If it is severe call 911 IMMEDIATELY. If it is mild, please call our office. ? ?We will call to schedule your test 2-4 weeks out understanding that some insurance companies will need an authorization prior to the service being performed.  ? ?For non-scheduling related questions, please contact the cardiac imaging nurse navigator should you have any questions/concerns: ?Rockwell Alexandria, Cardiac Imaging Nurse Navigator ?Larey Brick, Cardiac Imaging Nurse Navigator ?[facility] ?Direct Office Dial: [phone]  ? ?For scheduling needs, including cancellations and  rescheduling, please call [place], [phone]. ? ? ? ?Follow-Up: ?At [facility], you and your health needs are our priority.  As part of our continuing mission to provide you with exceptional heart care, we have created designated Provider Care Teams.  These Care Teams include your primary Cardiologist (physician) and  Advanced Practice Providers (APPs -  Physician Assistants and Nurse Practitioners) who all work together to provide you with the care you need, when you need it. ? ?We recommend signing up for the patient portal called "MyChart".  Sign up information is provided on this After Visit Summary.  MyChart is used to connect with patients for Virtual Visits (Telemedicine).  Patients are able to view lab/test results, encounter notes, upcoming appointments, etc.  Non-urgent messages can be sent to your provider as well.   ?To learn more about what you can do with MyChart, go to [email or web address].   ? ?Your next appointment:   ?2 month(s) ? ?The format for your next appointment:   ?In Person ? ?Provider:   ?Gypsy Balsam, MD  ? ? ?Other Instructions ?None ? ?

## 2021-07-29 NOTE — Progress Notes (Signed)
? ?Cardiology Consultation:   ? ?Date:  07/29/2021  ? ?ID:  Joshua [name], DOB 08/25/1970, MRN [id number] ? ?PCP:  Joshua [name], Joshua T, NP  ?Cardiologist:  Gypsy Balsamobert Daila Elbert, MD  ? ?Referring MD: Joshua [name], Joshua T, NP  ? ?Chief Complaint  ?Patient presents with  ? Follow-up  ? ? ?History of Present Illness:   ? ?Joshua Jaegerdmond A [name] is a 51 y.o. male who is being seen today for the evaluation of chest pain at the request of Joshua [name], Joshua T, NP.  Past medical history significant for DVT that he suffered from 3 years ago.  He has been put on anticoagulation and has been taking Xarelto since that time, also essential hypertension, dyslipidemia.  He recently presented to the hospital twice because of chest pain.  His troponin I initially was within gray zone but then became negative and he was discharged home with instructions to follow-up with us.  Episode chest pain happened when he started becoming very nervous she started having some heavy sensation in the chest.  There was some shortness of breath associated with this.  There were no palpitations there is no sweating.  He also described the fact when he walks or climbs stairs he gets short of breath easily and situation gets gradually worse he did have a very tragic year last year apparently his son died because of cancer then he has multiple family members die because of different diseases before he got very stressful time.  Things think it is stabilized right now. ?He does not smoke he does not exercise on the regular basis. ? ? ?Past Medical History:  ?Diagnosis Date  ? Arthritis   ? "wrists, knees, back, shoulders; everywhere" (11/01/2016)  ? Chronic lower back pain   ? Daily headache   ? "recently; related to pain in my left knee" (11/01/2016)  ? 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)   ? History of kidney stones   ? last flare up 2017  ? HTN (hypertension)   ? Hypertension   ? Migraine   ? "might get 1/month now" (11/01/2016)  ? OA (osteoarthritis) of knee   ?  unilateral primary OA of left knee  ? Obesity   ? OSA (obstructive sleep apnea)   ? "don'[name] wear the mask" (11/01/2016)  ? Personal history of thrombophlebitis   ? back in 2016  ? ? ?Past Surgical History:  ?Procedure Laterality Date  ? BACK SURGERY  1987/2005/2009; 03/25/2010; 2012 X 2; ?other dates  ? "I've had 10 total"  ? CARPAL TUNNEL RELEASE Bilateral   ? FOREARM SURGERY Right 2010  ? blood clots from my wrist to elbow" (11/01/2016)  ? HAND SURGERY Right 2010  ? "aneurysm in my palm"  ? INCISION AND DRAINAGE OF WOUND    ? lumbar  ? JOINT REPLACEMENT    ? LITHOTRIPSY  X 2  ? LUMBAR DISC SURGERY  ~ 6  ? LUMBAR SPINE HARDWARE REMOVAL  X 1  ? POSTERIOR LUMBAR FUSION  X 2  ? SHOULDER ARTHROSCOPY W/ ROTATOR CUFF REPAIR Right 2010  ? TOTAL KNEE ARTHROPLASTY Left 10/31/2016  ? Procedure: LEFT TOTAL KNEE ARTHROPLASTY;  Surgeon: Kathryne HitchBlackman, Christopher Y, MD;  [facility];  Service: Orthopedics;  Laterality: Left;  ? TOTAL KNEE ARTHROPLASTY Right 07/03/2017  ? Procedure: RIGHT TOTAL KNEE ARTHROPLASTY;  Surgeon: Kathryne HitchBlackman, Christopher Y, MD;  [facility];  Service: Orthopedics;  Laterality: Right;  ? ? ?Current Medications: ?Current Meds  ?Medication Sig  ?  amLODipine (NORVASC) 5 MG tablet Take 5 mg by mouth daily.  ? atorvastatin (LIPITOR) 10 MG tablet Take 10 mg by mouth daily.  ? metoprolol tartrate (LOPRESSOR) 100 MG tablet Take 1 tablet (100 mg total) by mouth once for 1 dose. Please take 2 hours before your CT.  ? rivaroxaban (XARELTO) 10 MG TABS tablet Take 1 tablet (10 mg total) by mouth daily.  ? valsartan-hydrochlorothiazide (DIOVAN-HCT) 320-12.5 MG tablet Take 1 tablet by mouth daily.  ? [DISCONTINUED] methylPREDNISolone (MEDROL) 4 MG tablet Medrol dose pack. Take as instructed (Patient taking differently: Take 4 mg by mouth. Medrol dose pack. Take as instructed)  ? [DISCONTINUED] olmesartan-hydrochlorothiazide (BENICAR HCT) 40-12.5 MG tablet Take 1 tablet by mouth daily.  ? [DISCONTINUED]  oxyCODONE-acetaminophen (ROXICET) 5-325 MG tablet Take 1-2 tablets by mouth 2 (two) times daily as needed for severe pain.  ? [DISCONTINUED] traMADol (ULTRAM) 50 MG tablet Take 1-2 tablets (50-100 mg total) by mouth every 6 (six) hours as needed. (Patient taking differently: Take 50-100 mg by mouth every 6 (six) hours as needed for moderate pain or severe pain.)  ?  ? ?Allergies:   Patient has no known allergies.  ? ?Social History  ? ?Socioeconomic History  ? Marital status: Unknown  ?  Spouse name: Not on file  ? Number of children: 3  ? Years of education: Not on file  ? Highest education level: Not on file  ?Occupational History  ? Occupation: Truck Hospital doctor  ?  Employer: [employer]  ?Tobacco Use  ? Smoking status: Never  ? Smokeless tobacco: Never  ?Vaping Use  ? Vaping Use: Never used  ?Substance and Sexual Activity  ? Alcohol use: No  ? Drug use: No  ? Sexual activity: Yes  ?Other Topics Concern  ? Not on file  ?Social History Narrative  ? ** Merged History Encounter **  ?    ? Full time. Married with 3 boys.   ? Walks everyday  ? ?Social Determinants of Health  ? ?Financial Resource Strain: Not on file  ?Food Insecurity: Not on file  ?Transportation Needs: Not on file  ?Physical Activity: Not on file  ?Stress: Not on file  ?Social Connections: Not on file  ?  ? ?Family History: ?The patient's family history includes Coronary artery disease in his mother; Heart disease in his father. ?ROS:   ?Please see the history of present illness.    ?All 14 point review of systems negative except as described per history of present illness. ? ?EKGs/Labs/Other Studies Reviewed:   ? ?The following studies were reviewed today: ?I did review record from hospita ? ?EKG:  EKG is  ordered today.  The ekg ordered today demonstrates normal sinus rhythm, APCs, normal QS complex duration morphology, nonspecific ST segment changes ? ?Recent Labs: ?No results found for requested labs within last 8760 hours.  ?Recent Lipid Panel ?No  results found for: CHOL, TRIG, HDL, CHOLHDL, VLDL, LDLCALC, LDLDIRECT ? ?Physical Exam:   ? ?VS:  BP 140/86 (BP Location: Left Arm, Patient Position: Sitting)   Pulse 94   Ht 6' (1.829 m)   Wt (!) 301 lb 3.2 oz (136.6 kg)   SpO2 96%   BMI 40.85 kg/m?    ? ?Wt Readings from Last 3 Encounters:  ?07/29/21 (!) 301 lb 3.2 oz (136.6 kg)  ?01/03/21 280 lb (127 kg)  ?06/22/17 266 lb 1.6 oz (120.7 kg)  ?  ? ?GEN:  Well nourished, well developed in no acute distress ?HEENT: Normal ?  NECK: No JVD; No carotid bruits ?LYMPHATICS: No lymphadenopathy ?CARDIAC: RRR, no murmurs, no rubs, no gallops ?RESPIRATORY:  Clear to auscultation without rales, wheezing or rhonchi  ?ABDOMEN: Soft, non-tender, non-distended ?MUSCULOSKELETAL:  No edema; No deformity  ?SKIN: Warm and dry ?NEUROLOGIC:  Alert and oriented x 3 ?PSYCHIATRIC:  Normal affect  ? ?ASSESSMENT:   ? ?1. Chest pain, unspecified type   ?2. Chest pain of uncertain etiology   ?3. 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   ?4. SLEEP APNEA, OBSTRUCTIVE   ?5. Dyslipidemia   ?6. DEEP VENOUS THROMBOPHLEBITIS, HX OF   ? ?PLAN:   ? ?In order of problems listed above: ? ?Chest pain in this gentleman with multiple risk factors for coronary artery disease we had a long discussion about what to do with the situation I think the best approach will be to do coronary CT angio he will be scheduled to have the test. ?Procedure including all risk benefits as well as alternatives. ?Dyspnea on exertion.  Echocardiogram to be done to assess left ventricle ejection fraction and also to assess right ventricle pressure. ?History of PE.  He is anticoagulated which I will continue. ?Dyslipidemia: We will await results of test before we give recommendation regarding treatment of this condition ? ? ?Medication Adjustments/Labs and Tests Ordered: ?Current medicines are reviewed at length with the patient today.  Concerns regarding medicines are outlined above.  ?Orders Placed  This Encounter  ?Procedures  ? CT CORONARY MORPH W/CTA COR W/SCORE W/CA W/CM &/OR WO/CM  ? Basic Metabolic Panel (BMET)  ? EKG 12-Lead  ? ECHOCARDIOGRAM COMPLETE  ? ?Meds ordered this encounter  ?Medications  ? metoprolol tartrate (L

## 2021-08-04 ENCOUNTER — Ambulatory Visit (INDEPENDENT_AMBULATORY_CARE_PROVIDER_SITE_OTHER): Payer: Managed Care, Other (non HMO)

## 2021-08-04 DIAGNOSIS — R079 Chest pain, unspecified: Secondary | ICD-10-CM

## 2021-08-04 DIAGNOSIS — I5030 Unspecified diastolic (congestive) heart failure: Secondary | ICD-10-CM

## 2021-08-04 DIAGNOSIS — I517 Cardiomegaly: Secondary | ICD-10-CM

## 2021-08-04 LAB — ECHOCARDIOGRAM COMPLETE
Area-P 1/2: 3.1 cm2
S' Lateral: 3.6 cm

## 2021-08-05 LAB — BASIC METABOLIC PANEL
BUN/Creatinine Ratio: 17 (ref 9–20)
BUN: 15 mg/dL (ref 6–24)
CO2: 24 mmol/L (ref 20–29)
Calcium: 9.4 mg/dL (ref 8.7–10.2)
Chloride: 102 mmol/L (ref 96–106)
Creatinine, Ser: 0.86 mg/dL (ref 0.76–1.27)
Glucose: 98 mg/dL (ref 70–99)
Potassium: 3.8 mmol/L (ref 3.5–5.2)
Sodium: 139 mmol/L (ref 134–144)
eGFR: 104 mL/min/{1.73_m2} (ref >59)

## 2021-08-12 ENCOUNTER — Telehealth (HOSPITAL_COMMUNITY): Payer: Self-pay | Admitting: *Deleted

## 2021-08-12 NOTE — Telephone Encounter (Signed)
Reaching out to patient to offer assistance regarding upcoming cardiac imaging study; pt verbalizes understanding of appt date/time, parking situation and where to check in, pre-test NPO status and medications ordered, and verified current allergies; name and call back number provided for further questions should they arise  Graceyn Fodor RN Navigator Cardiac Imaging [facility] [phone] office [phone] cell  Patient to take 100mg metoprolol tartrate two hours prior to his cardiac CT scan. He is aware to arrive at 8am. 

## 2021-08-15 ENCOUNTER — Ambulatory Visit (HOSPITAL_COMMUNITY)
Admission: RE | Admit: 2021-08-15 | Discharge: 2021-08-15 | Disposition: A | Discharge status: Home / Self Care | Payer: Commercial Managed Care - HMO | Source: Ambulatory Visit | Attending: Cardiology | Admitting: Cardiology

## 2021-08-15 DIAGNOSIS — R079 Chest pain, unspecified: Secondary | ICD-10-CM

## 2021-08-15 MED ORDER — METOPROLOL TARTRATE 5 MG/5ML IV SOLN
5.0000 mg | INTRAVENOUS | Status: DC | PRN
Start: 2021-08-15 — End: 2021-08-16
  Administered 2021-08-15: 5 mg via INTRAVENOUS

## 2021-08-15 MED ORDER — NITROGLYCERIN 0.4 MG SL SUBL
0.8000 mg | SUBLINGUAL_TABLET | Freq: Once | SUBLINGUAL | Status: AC
  Administered 2021-08-15: 0.8 mg via SUBLINGUAL

## 2021-08-15 MED ORDER — IOHEXOL 350 MG/ML SOLN
95.0000 mL | Freq: Once | INTRAVENOUS | Status: AC | PRN
  Administered 2021-08-15: 95 mL via INTRAVENOUS

## 2021-08-15 MED ORDER — METOPROLOL TARTRATE 5 MG/5ML IV SOLN
INTRAVENOUS | Status: AC
  Filled 2021-08-15: qty 10

## 2021-08-15 MED ORDER — NITROGLYCERIN 0.4 MG SL SUBL
SUBLINGUAL_TABLET | SUBLINGUAL | Status: AC
  Filled 2021-08-15: qty 2

## 2021-08-15 NOTE — Progress Notes (Signed)
CT scan completed. Tolerated well. D/C home ambulatory, awake and alert. In no distress. 

## 2021-08-17 ENCOUNTER — Telehealth: Payer: Self-pay | Admitting: Cardiology

## 2021-08-17 NOTE — Telephone Encounter (Signed)
Advised patient that his CT has not been reviewed by Dr. Agustin Cree yet, however once it has his nurse will give him a call. Patient verbalized understanding.  ?

## 2021-08-17 NOTE — Telephone Encounter (Signed)
? ?  Pt is following up his CT result  ?

## 2021-08-19 NOTE — Telephone Encounter (Signed)
Joshua Liter, MD  ?08/18/2021 10:48 AM EDT   ?  ?Sadly this is another coronary CT angio that this uninterpretable.  Good news is the calcium score is 0 which make very low chances of coronary artery disease.  We will talk about options what to do next during the visit  ? ? ?Informed patient of his results, as written above. Patient's next visit is in 2 months. Patient stated this is a long time to wait to discuss everything with the way he has been feeling. Patient stated that he was possibly in A. FIB during his echo, and could this be causing all his symptoms. Patient stated his BP is usually good, last one 115/75. Patient stated his HR 90's today, but he is not doing anything today. Patient stated when he is active his HR is over 100, he feels palpitations and fluttering, SOB and tired. Patient stated he cannot work like this. Informed patient that there is not a dx of A. FIB in his chart, but maybe he needs a monitor to see what is going on with his heart rhythm when he is active. Will forward to Dr. Agustin Cree for advisement and see if he wants to order an event monitor. ?

## 2021-08-19 NOTE — Telephone Encounter (Signed)
Patient called back to ask about CT results  ?

## 2021-08-19 NOTE — Telephone Encounter (Signed)
Called patient with Dr. Vanetta Shawl advisement. Per Dr. Bing Matter, Please move her appointment sooner so we can discuss this in details.  It is okay to double book. Made patient an appointment on 08/24/20. Patient agree to plan. ?

## 2021-08-24 ENCOUNTER — Ambulatory Visit: Payer: Managed Care, Other (non HMO) | Admitting: Cardiology

## 2021-08-24 VITALS — BP 134/84 | HR 100 | Ht 72.0 in | Wt 298.0 lb

## 2021-08-24 DIAGNOSIS — G4733 Obstructive sleep apnea (adult) (pediatric): Secondary | ICD-10-CM | POA: Diagnosis not present

## 2021-08-24 DIAGNOSIS — E785 Hyperlipidemia, unspecified: Secondary | ICD-10-CM

## 2021-08-24 DIAGNOSIS — R0789 Other chest pain: Secondary | ICD-10-CM

## 2021-08-24 DIAGNOSIS — Z6841 Body Mass Index (BMI) 40.0 and over, adult: Secondary | ICD-10-CM

## 2021-08-24 DIAGNOSIS — R072 Precordial pain: Secondary | ICD-10-CM

## 2021-08-24 DIAGNOSIS — I119 Hypertensive heart disease without heart failure: Secondary | ICD-10-CM

## 2021-08-24 NOTE — Progress Notes (Signed)
?Cardiology Office Note:   ? ?Date:  08/24/2021  ? ?ID:  [name], DOB Dec 25, 1970, MRN [id number] ? ?PCP:  Alinda Deem, MD  ?Cardiologist:  Gypsy Balsam, MD   ? ?Referring MD: Julianne Handler, NP  ? ?No chief complaint on file. ? ? ?History of Present Illness:   ? ?Joshua [name] is a 51 y.o. male he came to me for follow-up initial referral was because of dyspnea on exertion some atypical chest pain.  Would like to rule out coronary artery disease, coronary CT angio has been performed however was uninterpretable.  He is additional problems include history of DVT, essential hypertension, morbid obesity, obstructive sleep apnea not treated.  He comes today to discuss results of his coronary CT angio obviously he is upset about the fact that the test was uninterpretable.  The only conclusion rehab based on the test is the fact that his calcium score was 0 and I would probably drop the issue of CAD evaluation except for the fact that he still complain of having some symptoms of burning shortness of breath fatigue and some chest pain. ? ?Past Medical History:  ?Diagnosis Date  ? Arthritis   ? "wrists, knees, back, shoulders; everywhere" (11/01/2016)  ? Chronic lower back pain   ? Daily headache   ? "recently; related to pain in my left knee" (11/01/2016)  ? 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)   ? History of kidney stones   ? last flare up 2017  ? HTN (hypertension)   ? Hypertension   ? Migraine   ? "might get 1/month now" (11/01/2016)  ? OA (osteoarthritis) of knee   ? unilateral primary OA of left knee  ? Obesity   ? OSA (obstructive sleep apnea)   ? "don't wear the mask" (11/01/2016)  ? Personal history of thrombophlebitis   ? back in 2016  ? ? ?Past Surgical History:  ?Procedure Laterality Date  ? BACK SURGERY  1987/2005/2009; 03/25/2010; 2012 X 2; ?other dates  ? "I've had 10 total"  ? CARPAL TUNNEL RELEASE Bilateral   ? FOREARM SURGERY Right 2010  ? blood clots from my wrist to elbow"  (11/01/2016)  ? HAND SURGERY Right 2010  ? "aneurysm in my palm"  ? INCISION AND DRAINAGE OF WOUND    ? lumbar  ? JOINT REPLACEMENT    ? LITHOTRIPSY  X 2  ? LUMBAR DISC SURGERY  ~ 6  ? LUMBAR SPINE HARDWARE REMOVAL  X 1  ? POSTERIOR LUMBAR FUSION  X 2  ? SHOULDER ARTHROSCOPY W/ ROTATOR CUFF REPAIR Right 2010  ? TOTAL KNEE ARTHROPLASTY Left 10/31/2016  ? Procedure: LEFT TOTAL KNEE ARTHROPLASTY;  Surgeon: Kathryne Hitch, MD;  Location: [facility] OR;  Service: Orthopedics;  Laterality: Left;  ? TOTAL KNEE ARTHROPLASTY Right 07/03/2017  ? Procedure: RIGHT TOTAL KNEE ARTHROPLASTY;  Surgeon: Kathryne Hitch, MD;  Location: [facility] OR;  Service: Orthopedics;  Laterality: Right;  ? ? ?Current Medications: ?No outpatient medications have been marked as taking for the 08/24/21 encounter (Office Visit) with Georgeanna Lea, MD.  ?  ? ?Allergies:   Patient has no known allergies.  ? ?Social History  ? ?Socioeconomic History  ? Marital status: Unknown  ?  Spouse name: Not on file  ? Number of children: 3  ? Years of education: Not on file  ? Highest education level: Not on file  ?Occupational History  ? Occupation: Truck Hospital doctor  ?  Employer: [employer]  ?  Tobacco Use  ? Smoking status: Never  ? Smokeless tobacco: Never  ?Vaping Use  ? Vaping Use: Never used  ?Substance and Sexual Activity  ? Alcohol use: No  ? Drug use: No  ? Sexual activity: Yes  ?Other Topics Concern  ? Not on file  ?Social History Narrative  ? ** Merged History Encounter **  ?    ? Full time. Married with 3 boys.   ? Walks everyday  ? ?Social Determinants of Health  ? ?Financial Resource Strain: Not on file  ?Food Insecurity: Not on file  ?Transportation Needs: Not on file  ?Physical Activity: Not on file  ?Stress: Not on file  ?Social Connections: Not on file  ?  ? ?Family History: ?The patient's family history includes Coronary artery disease in his mother; Heart disease in his father. ?ROS:   ?Please see the history of present illness.    ?All 14  point review of systems negative except as described per history of present illness ? ?EKGs/Labs/Other Studies Reviewed:   ? ? ? ?Recent Labs: ?08/04/2021: BUN 15; Creatinine, Ser 0.86; Potassium 3.8; Sodium 139  ?Recent Lipid Panel ?No results found for: CHOL, TRIG, HDL, CHOLHDL, VLDL, LDLCALC, LDLDIRECT ? ?Physical Exam:   ? ?VS:  BP 134/84 (BP Location: Left Arm, Patient Position: Sitting)   Pulse 100   Ht 6' (1.829 m)   Wt 298 lb (135.2 kg)   SpO2 96%   BMI 40.42 kg/m?    ? ?Wt Readings from Last 3 Encounters:  ?08/24/21 298 lb (135.2 kg)  ?07/29/21 (!) 301 lb 3.2 oz (136.6 kg)  ?01/03/21 280 lb (127 kg)  ?  ? ?GEN:  Well nourished, well developed in no acute distress ?HEENT: Normal ?NECK: No JVD; No carotid bruits ?LYMPHATICS: No lymphadenopathy ?CARDIAC: RRR, no murmurs, no rubs, no gallops ?RESPIRATORY:  Clear to auscultation without rales, wheezing or rhonchi  ?ABDOMEN: Soft, non-tender, non-distended ?MUSCULOSKELETAL:  No edema; No deformity  ?SKIN: Warm and dry ?LOWER EXTREMITIES: no swelling ?NEUROLOGIC:  Alert and oriented x 3 ?PSYCHIATRIC:  Normal affect  ? ?ASSESSMENT:   ? ?1. 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 SLEEP APNEA, OBSTRUCTIVE   ?3. Dyslipidemia   ?4. BMI 40.0-44.9, adult (HCC)   ?5. Atypical chest pain   ? ?PLAN:   ? ?In order of problems listed above: ? ?Atypical chest pain.  We will push towards stress testing.  We will do 2 days a Lexiscan.  I do not think we reached the point of cardiac catheterization is necessary.  Coronary CT angio was uninterpretable however likely his calcium score is 0 but he still gets symptoms. ?Sleep apnea he does not use any CPAP mask.  He is just simply headed and he does not want to use it which is a.  Because that could make him feel better. ?Dyslipidemia he is on Lipitor 10 which I will continue.  I did review K PN which show his LDL of 110 however this data is from 2019.  We will recheck his fasting lipid  profile. ?Obesity, obviously still significant problem he understand he will try to work on losing some weight. ? ? ?Medication Adjustments/Labs and Tests Ordered: ?Current medicines are reviewed at length with the patient today.  Concerns regarding medicines are outlined above.  ?No orders of the defined types were placed in this encounter. ? ?Medication changes: No orders of the defined types were placed in this encounter. ? ? ?Signed, ?Marveen Reeksobert J.  Bing Matter, MD, [facility] ?08/24/2021 10:41 AM    ?[facility] ? ?

## 2021-08-24 NOTE — Patient Instructions (Signed)
Medication Instructions:  ?Your physician recommends that you continue on your current medications as directed. Please refer to the Current Medication list given to you today.  ?*If you need a refill on your cardiac medications before your next appointment, please call your pharmacy* ? ? ?Lab Work: ?None Ordered ?If you have labs (blood work) drawn today and your tests are completely normal, you will receive your results only by: ?MyChart Message (if you have MyChart) OR ?A paper copy in the mail ?If you have any lab test that is abnormal or we need to change your treatment, we will call you to review the results. ? ? ?Testing/Procedures: ?Your physician has requested that you have a lexiscan myoview. For further information please visit [email or web address]. Please follow instruction sheet, as given. ? ?The test will take approximately 3 to 4 hours to complete; you may bring reading material.  If someone comes with you to your appointment, they will need to remain in the main lobby due to limited space in the testing area. **If you are pregnant or breastfeeding, please notify the nuclear lab prior to your appointment** ? ?How to prepare for your Myocardial Perfusion Test: ?Do not eat or drink 3 hours prior to your test, except you may have water. ?Do not consume products containing caffeine (regular or decaffeinated) 12 hours prior to your test. (ex: coffee, chocolate, sodas, tea). ?Do bring a list of your current medications with you.  If not listed below, you may take your medications as normal. ?Do wear comfortable clothes (no dresses or overalls) and walking shoes, tennis shoes preferred (No heels or open toe shoes are allowed). ?Do NOT wear cologne, perfume, aftershave, or lotions (deodorant is allowed). ?If these instructions are not followed, your test will have to be rescheduled.   ? ? ?Follow-Up: ?At [facility], you and your health needs are our priority.  As part of our continuing mission to provide  you with exceptional heart care, we have created designated Provider Care Teams.  These Care Teams include your primary Cardiologist (physician) and Advanced Practice Providers (APPs -  Physician Assistants and Nurse Practitioners) who all work together to provide you with the care you need, when you need it. ? ?We recommend signing up for the patient portal called "MyChart".  Sign up information is provided on this After Visit Summary.  MyChart is used to connect with patients for Virtual Visits (Telemedicine).  Patients are able to view lab/test results, encounter notes, upcoming appointments, etc.  Non-urgent messages can be sent to your provider as well.   ?To learn more about what you can do with MyChart, go to [email or web address].   ? ?Your next appointment:   ?3 month(s) ? ?The format for your next appointment:   ?In Person ? ?Provider:   ?Robert Krasowski, MD  ? ? ?Other Instructions ?NA  ?

## 2021-08-24 NOTE — Addendum Note (Signed)
Addended by: Baldo Ash D on: 08/24/2021 10:51 AM ? ? Modules accepted: Orders ? ?

## 2021-08-24 NOTE — Progress Notes (Signed)
Ekg

## 2021-08-24 NOTE — Addendum Note (Signed)
Addended by: Baldo Ash D on: 08/24/2021 10:57 AM ? ? Modules accepted: Orders ? ?

## 2021-08-25 NOTE — Addendum Note (Signed)
Addended by: Gypsy Balsam on: 08/25/2021 08:43 AM ? ? Modules accepted: Orders ? ?

## 2021-08-30 ENCOUNTER — Telehealth: Payer: Self-pay | Admitting: *Deleted

## 2021-08-30 NOTE — Telephone Encounter (Signed)
Left message on voicemail per DPR in reference to upcoming appointment scheduled on 09/06/21 at 1100 with detailed instructions given per Myocardial Perfusion Study Information Sheet for the test. LM to arrive 15 minutes early, and that it is imperative to arrive on time for appointment to keep from having the test rescheduled. If you need to cancel or reschedule your appointment, please call the office within 24 hours of your appointment. Failure to do so may result in a cancellation of your appointment, and a $50 no show fee. Phone number given for call back for any questions. Joshua [name], [name] ? ?

## 2021-09-06 ENCOUNTER — Ambulatory Visit (INDEPENDENT_AMBULATORY_CARE_PROVIDER_SITE_OTHER): Payer: Commercial Managed Care - HMO

## 2021-09-06 DIAGNOSIS — R0789 Other chest pain: Secondary | ICD-10-CM | POA: Diagnosis not present

## 2021-09-06 DIAGNOSIS — R072 Precordial pain: Secondary | ICD-10-CM | POA: Diagnosis not present

## 2021-09-06 MED ORDER — REGADENOSON 0.4 MG/5ML IV SOLN
0.4000 mg | Freq: Once | INTRAVENOUS | Status: AC
  Administered 2021-09-06: 0.4 mg via INTRAVENOUS

## 2021-09-06 MED ORDER — TECHNETIUM TC 99M TETROFOSMIN IV KIT
32.2000 | PACK | Freq: Once | INTRAVENOUS | Status: AC | PRN
Start: 2021-09-06 — End: 2021-09-06
  Administered 2021-09-06: 32.2 via INTRAVENOUS

## 2021-09-07 ENCOUNTER — Ambulatory Visit: Payer: Commercial Managed Care - HMO

## 2021-09-07 LAB — MYOCARDIAL PERFUSION IMAGING
LV dias vol: 131 mL (ref 62–150)
LV sys vol: 60 mL
Nuc Stress EF: 55 %
Peak HR: 101 {beats}/min
Rest HR: 82 {beats}/min
Rest Nuclear Isotope Dose: 29.5 mCi
SDS: 1
SRS: 1
SSS: 2
Stress Nuclear Isotope Dose: 32.2 mCi
TID: 0.87

## 2021-09-07 MED ORDER — TECHNETIUM TC 99M TETROFOSMIN IV KIT
29.5000 | PACK | Freq: Once | INTRAVENOUS | Status: AC | PRN
Start: 2021-09-07 — End: 2021-09-07
  Administered 2021-09-07: 29.5 via INTRAVENOUS

## 2021-09-12 ENCOUNTER — Telehealth: Payer: Self-pay | Admitting: Cardiology

## 2021-09-12 NOTE — Telephone Encounter (Signed)
Pt returning call from nurse regarding test results. Please advise ?

## 2021-09-12 NOTE — Telephone Encounter (Signed)
Patient informed of results.  

## 2021-09-26 ENCOUNTER — Telehealth: Payer: Self-pay | Admitting: Cardiology

## 2021-09-26 NOTE — Telephone Encounter (Signed)
Patient calling to see since his test or back. Will the dr. refer him to lung dr or will he release him back to work. Please advise

## 2021-09-28 NOTE — Telephone Encounter (Signed)
Pt states that Dr. Bing Matter was the provider who took him out of work. Pt aware that Dr. Bing Matter will return on 10/10/21.

## 2021-09-28 NOTE — Telephone Encounter (Signed)
Atypical chest pain.  We will push towards stress testing.  We will do 2 days a Lexiscan.  I do not think we reached the point of cardiac catheterization is necessary.  Coronary CT angio was uninterpretable however likely his calcium score is 0 but he still gets symptoms. Sleep apnea he does not use any CPAP mask.  He is just simply headed and he does not want to use it which is a.  Because that could make him feel better. Dyslipidemia he is on Lipitor 10 which I will continue.  I did review K PN which show his LDL of 110 however this data is from 2019.  We will recheck his fasting lipid profile. Obesity, obviously still significant problem he understand he will try to work on losing some weight.  Pt had a stress test and the results were:  The study is normal. The study is low risk.   Left ventricular function is normal. Nuclear stress EF: 55 %. The left ventricular ejection fraction is normal (55-65%). End diastolic cavity size is normal.   Prior study not available for comparison.  Pt aware that it will be 10/10/21 before Dr. Bing Matter returns the office. Please advise if pt can return to work or needs to see pulmonology which he would need a referral.

## 2021-09-28 NOTE — Telephone Encounter (Signed)
Patient called to check on status of his call.  

## 2021-10-03 ENCOUNTER — Ambulatory Visit: Payer: Managed Care, Other (non HMO) | Admitting: Cardiology

## 2021-10-05 ENCOUNTER — Telehealth: Payer: Self-pay | Admitting: Cardiology

## 2021-10-05 NOTE — Telephone Encounter (Signed)
Left vm to call back

## 2021-10-05 NOTE — Telephone Encounter (Signed)
Pt aware that Dr. Kirtland Bouchard remains out of town and will address upon his return.

## 2021-10-05 NOTE — Telephone Encounter (Signed)
Patient called stating he needs a note to be released to go back to work.

## 2021-12-01 ENCOUNTER — Encounter: Payer: Self-pay | Admitting: Cardiology

## 2021-12-01 ENCOUNTER — Ambulatory Visit: Payer: Commercial Managed Care - HMO | Admitting: Cardiology

## 2021-12-01 VITALS — BP 130/88 | HR 97 | Ht 72.0 in | Wt 299.0 lb

## 2021-12-01 DIAGNOSIS — R0789 Other chest pain: Secondary | ICD-10-CM | POA: Diagnosis not present

## 2021-12-01 DIAGNOSIS — I119 Hypertensive heart disease without heart failure: Secondary | ICD-10-CM | POA: Diagnosis not present

## 2021-12-01 DIAGNOSIS — E785 Hyperlipidemia, unspecified: Secondary | ICD-10-CM

## 2021-12-01 DIAGNOSIS — G4733 Obstructive sleep apnea (adult) (pediatric): Secondary | ICD-10-CM | POA: Diagnosis not present

## 2021-12-01 NOTE — Progress Notes (Signed)
Cardiology Office Note:    Date:  12/01/2021   ID:  Joshua [name], DOB 17-May-1970, MRN [id number]  PCP:  Alinda Deem, MD  Cardiologist:  Gypsy Balsam, MD    Referring MD: Alinda Deem, MD   Chief Complaint  Patient presents with   Follow-up  Doing the same  History of Present Illness:    Joshua [name] is a 51 y.o. male with past medical history significant for morbid obesity, essential hypertension, dyslipidemia, obstructive sleep apnea.  History of DVT anticoagulated, he was referred to [place] because of atypical chest pain.  Initial approach was to do coronary CT angio however test was practically uninterpretable.  After that he did have 2 days protocol stress study which was negative for exercise-induced myocardial ischemia on top of that calcium score was 0.  He comes today to talk about his issues.  Overall cardiac wise doing well.  He denies have any chest pain tightness squeezing pressure burning chest shortness of breath fatigue is still there.  He tells me that about 20 years ago he was told to have sleep apnea.  However he could not tolerate equipment.  He is not being treated.  On top of that he described the fact that he got the 3 times COVID and 5 times pneumonia still have some difficulty shaking of that.  Past Medical History:  Diagnosis Date   Arthritis    "wrists, knees, back, shoulders; everywhere" (11/01/2016)   Chronic lower back pain    Daily headache    "recently; related to pain in my left knee" (11/01/2016)   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)    History of kidney stones    last flare up 2017   HTN (hypertension)    Hypertension    Migraine    "might get 1/month now" (11/01/2016)   OA (osteoarthritis) of knee    unilateral primary OA of left knee   Obesity    OSA (obstructive sleep apnea)    "don't wear the mask" (11/01/2016)   Personal history of thrombophlebitis    back in 2016    Past Surgical History:  Procedure Laterality Date    BACK SURGERY  1987/2005/2009; 03/25/2010; 2012 X 2; ?other dates   "I've had 10 total"   CARPAL TUNNEL RELEASE Bilateral    FOREARM SURGERY Right 2010   blood clots from my wrist to elbow" (11/01/2016)   HAND SURGERY Right 2010   "aneurysm in my palm"   INCISION AND DRAINAGE OF WOUND     lumbar   JOINT REPLACEMENT     LITHOTRIPSY  X 2   LUMBAR DISC SURGERY  ~ 6   LUMBAR SPINE HARDWARE REMOVAL  X 1   POSTERIOR LUMBAR FUSION  X 2   SHOULDER ARTHROSCOPY W/ ROTATOR CUFF REPAIR Right 2010   TOTAL KNEE ARTHROPLASTY Left 10/31/2016   Procedure: LEFT TOTAL KNEE ARTHROPLASTY;  Surgeon: Kathryne Hitch, MD;  Location: [facility] OR;  Service: Orthopedics;  Laterality: Left;   TOTAL KNEE ARTHROPLASTY Right 07/03/2017   Procedure: RIGHT TOTAL KNEE ARTHROPLASTY;  Surgeon: Kathryne Hitch, MD;  Location: [facility] OR;  Service: Orthopedics;  Laterality: Right;    Current Medications: Current Meds  Medication Sig   amLODipine (NORVASC) 5 MG tablet Take 5 mg by mouth daily.   atorvastatin (LIPITOR) 10 MG tablet Take 10 mg by mouth daily.   metoprolol tartrate (LOPRESSOR) 100 MG tablet Take 1 tablet (100 mg total) by mouth once for 1 dose.  Please take 2 hours before your CT.   rivaroxaban (XARELTO) 10 MG TABS tablet Take 1 tablet (10 mg total) by mouth daily.   valsartan-hydrochlorothiazide (DIOVAN-HCT) 320-12.5 MG tablet Take 1 tablet by mouth daily.     Allergies:   Patient has no known allergies.   Social History   Socioeconomic History   Marital status: Unknown    Spouse name: Not on file   Number of children: 3   Years of education: Not on file   Highest education level: Not on file  Occupational History   Occupation: Truck Air traffic controller: [employer]  Tobacco Use   Smoking status: Never   Smokeless tobacco: Never  Vaping Use   Vaping Use: Never used  Substance and Sexual Activity   Alcohol use: No   Drug use: No   Sexual activity: Yes  Other Topics Concern   Not on  file  Social History Narrative   ** Merged History Encounter **       Full time. Married with 3 boys.    Walks everyday   Social Determinants of Health   Financial Resource Strain: Not on file  Food Insecurity: Not on file  Transportation Needs: Not on file  Physical Activity: Not on file  Stress: Not on file  Social Connections: Not on file     Family History: The patient's family history includes Coronary artery disease in his mother; Heart disease in his father. ROS:   Please see the history of present illness.    All 14 point review of systems negative except as described per history of present illness  EKGs/Labs/Other Studies Reviewed:      Recent Labs: 08/04/2021: BUN 15; Creatinine, Ser 0.86; Potassium 3.8; Sodium 139  Recent Lipid Panel No results found for: "CHOL", "TRIG", "HDL", "CHOLHDL", "VLDL", "LDLCALC", "LDLDIRECT"  Physical Exam:    VS:  BP 130/88 (BP Location: Left Arm, Patient Position: Sitting)   Pulse 97   Ht 6' (1.829 m)   Wt 299 lb (135.6 kg)   SpO2 93%   BMI 40.55 kg/m     Wt Readings from Last 3 Encounters:  12/01/21 299 lb (135.6 kg)  09/06/21 298 lb (135.2 kg)  08/24/21 298 lb (135.2 kg)     GEN:  Well nourished, well developed in no acute distress HEENT: Normal NECK: No JVD; No carotid bruits LYMPHATICS: No lymphadenopathy CARDIAC: RRR, no murmurs, no rubs, no gallops RESPIRATORY:  Clear to auscultation without rales, wheezing or rhonchi  ABDOMEN: Soft, non-tender, non-distended MUSCULOSKELETAL:  No edema; No deformity  SKIN: Warm and dry LOWER EXTREMITIES: no swelling NEUROLOGIC:  Alert and oriented x 3 PSYCHIATRIC:  Normal affect   ASSESSMENT:    1. 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 SLEEP APNEA, OBSTRUCTIVE   3. Dyslipidemia   4. Atypical chest pain    PLAN:    In order of problems listed above:  Essential hypertension: Blood pressure well-controlled we will continue present  management. Dyslipidemia I did review K PN which show me his LDL 84 HDL 42 and this is on Lipitor 10 which I will continue History of PE.  Anticoagulated which I will continue Atypical chest pain denies having any stress test negative calcium score 0.  The key will be risk factors modifications. Obstructive sleep apnea with history of pneumonia x 5.  He will be referred to pulmonology to discuss history of sleep apnea and proper management of this problem as well as  his frequent pneumonias.   Medication Adjustments/Labs and Tests Ordered: Current medicines are reviewed at length with the patient today.  Concerns regarding medicines are outlined above.  No orders of the defined types were placed in this encounter.  Medication changes: No orders of the defined types were placed in this encounter.   Signed, Georgeanna Lea, MD, [facility] 12/01/2021 11:13 AM    [facility]

## 2021-12-01 NOTE — Patient Instructions (Addendum)
Medication Instructions:  Your physician recommends that you continue on your current medications as directed. Please refer to the Current Medication list given to you today.  *If you need a refill on your cardiac medications before your next appointment, please call your pharmacy*   Lab Work: None Ordered If you have labs (blood work) drawn today and your tests are completely normal, you will receive your results only by: MyChart Message (if you have MyChart) OR A paper copy in the mail If you have any lab test that is abnormal or we need to change your treatment, we will call you to review the results.   Testing/Procedures: None Ordered   Follow-Up: At [facility], you and your health needs are our priority.  As part of our continuing mission to provide you with exceptional heart care, we have created designated Provider Care Teams.  These Care Teams include your primary Cardiologist (physician) and Advanced Practice Providers (APPs -  Physician Assistants and Nurse Practitioners) who all work together to provide you with the care you need, when you need it.  We recommend signing up for the patient portal called "MyChart".  Sign up information is provided on this After Visit Summary.  MyChart is used to connect with patients for Virtual Visits (Telemedicine).  Patients are able to view lab/test results, encounter notes, upcoming appointments, etc.  Non-urgent messages can be sent to your provider as well.   To learn more about what you can do with MyChart, go to [email or web address].    Your next appointment:   1 year(s)  The format for your next appointment:   In Person  Provider:   Gypsy Balsam, MD    Other Instructions Referral made to Pulmonology- They will call for appt

## 2021-12-16 NOTE — Progress Notes (Unsigned)
12/19/21- 56 yoM never smoker for sleep evaluation courtesy of Dr Bing Matter with concern of OSA. Medical problem list includes HTN, DVT Upper Extr, Migraine, Osteoarthritis, Low Back Pain, Dyslipidemia, Remote sleep study- didn't like CPAP.  Epworth score- Body weight today- Covid vax-

## 2021-12-19 ENCOUNTER — Ambulatory Visit (INDEPENDENT_AMBULATORY_CARE_PROVIDER_SITE_OTHER): Payer: Commercial Managed Care - HMO | Admitting: Internal Medicine

## 2021-12-19 ENCOUNTER — Encounter: Payer: Self-pay | Admitting: Internal Medicine

## 2021-12-19 VITALS — BP 134/78 | HR 91 | Temp 98.4°F | Ht 72.0 in | Wt 302.2 lb

## 2021-12-19 DIAGNOSIS — G4733 Obstructive sleep apnea (adult) (pediatric): Secondary | ICD-10-CM | POA: Diagnosis not present

## 2021-12-19 DIAGNOSIS — I119 Hypertensive heart disease without heart failure: Secondary | ICD-10-CM

## 2021-12-19 NOTE — Assessment & Plan Note (Signed)
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 We went over basics, answered questions. He didn't like experience with CPAP headgear then. Alternatives discussed. He wasn't attracted to [place] and probably too heavy, but asked about Somnoplasty and oral appliances.

## 2021-12-19 NOTE — Patient Instructions (Signed)
Order- schedule home sleep test     dx OSA  Please call us about 2 weeks after your sleep test for results and recommendations 

## 2021-12-19 NOTE — Assessment & Plan Note (Signed)
He is working with his cardiologist.

## 2022-01-31 ENCOUNTER — Encounter: Payer: Self-pay | Admitting: Orthopaedic Surgery

## 2022-01-31 ENCOUNTER — Ambulatory Visit (INDEPENDENT_AMBULATORY_CARE_PROVIDER_SITE_OTHER): Payer: Commercial Managed Care - HMO

## 2022-01-31 ENCOUNTER — Ambulatory Visit (INDEPENDENT_AMBULATORY_CARE_PROVIDER_SITE_OTHER): Payer: Commercial Managed Care - HMO | Admitting: Orthopaedic Surgery

## 2022-01-31 DIAGNOSIS — M5441 Lumbago with sciatica, right side: Secondary | ICD-10-CM | POA: Diagnosis not present

## 2022-01-31 DIAGNOSIS — M5442 Lumbago with sciatica, left side: Secondary | ICD-10-CM

## 2022-01-31 DIAGNOSIS — G8929 Other chronic pain: Secondary | ICD-10-CM

## 2022-01-31 MED ORDER — METHYLPREDNISOLONE 4 MG PO TABS: ORAL_TABLET | ORAL | 0 refills | Status: DC

## 2022-01-31 NOTE — Progress Notes (Signed)
Office Visit Note   Patient: Joshua [name]           Date of Birth: 25-Aug-1970           MRN: [id number] Visit Date: 01/31/2022              Requested by: Alinda Deem, MD [address] [place],  [place] [address] PCP: Alinda Deem, MD   Assessment & Plan: Visit Diagnoses:  1. Chronic bilateral low back pain with bilateral sciatica     Plan: We will send him to Dr. Noel Gerold for second opinion on his low back as he has had multiple surgeries by Dr. [name] and does not wish to follow-up with Dr. [name] at this point time.  We will also place him on a Medrol Dosepak.  Offered him muscle relaxants but he states that these do not give him any relief.  He is unable to take NSAIDs as he is on chronic anticoagulation.  He will continue to take Tylenol for pain.  Questions were encouraged and answered by Dr. Magnus Ivan and myself.  Follow-Up Instructions: Return if symptoms worsen or fail to improve.   Orders:  Orders Placed This Encounter  Procedures   XR Lumbar Spine 2-3 Views   Meds ordered this encounter  Medications   methylPREDNISolone (MEDROL) 4 MG tablet    Sig: Take as directed    Dispense:  21 tablet    Refill:  0      Procedures: No procedures performed   Clinical Data: No additional findings.   Subjective: Chief Complaint  Patient presents with   Lower Back - Pain    HPI [name] 51 year old male comes in today with low back pain.  He is had some 12 surgeries on his lumbar spine over the years.  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  He is also having pain down the posterior aspects of both thighs to the popliteal region of both knees.  No new injury.  He also notes occasional warm sensation down both legs.  States since the last back surgery he has had numbness in his  groin.  Does note that the back pain awakens him.  He has had no new bowel or bladder dysfunction.  He notes spasms in his low back and legs.  Ranks his pain to be 10 out of 10 pain is worse on his right side.  He is nondiabetic.  Review of Systems  Constitutional:  Negative for chills and fever.  Musculoskeletal:  Positive for back pain.     Objective: Vital Signs: There were no vitals taken for this visit.  Physical Exam Constitutional:      Appearance: He is not ill-appearing or diaphoretic.  Pulmonary:     Effort: Pulmonary effort is normal.  Neurological:     Mental Status: He is alert and oriented to person, place, and time.  Psychiatric:        Mood and Affect: Mood normal.     Ortho Exam Bilateral lower extremities 5 out of 5 strength throughout lower extremities against resistance except for extension of the left great toe which is 4 out of 5.  Tight hamstrings bilaterally.  Negative straight leg raise bilaterally.  Bilateral knees good range of motion without pain.  No  instability valgus varus stressing.  Specialty Comments:  No specialty comments available.  Imaging: XR Lumbar Spine 2-3 Views  Result Date: 01/31/2022 Lumbar spine 2 views: Postsurgical changes with fusion L2-S1.  Hardware has been explanted.  Degenerative changes at T12-L1.  No prior films to compare.  No gross acute findings.    PMFS History: Patient Active Problem List   Diagnosis Date Noted   Atypical chest pain 07/29/2021   Family history of colon cancer requiring screening colonoscopy 03/16/2020   BMI 40.0-44.9, adult (HCC) 03/16/2020   Screening examination for other arthropod-borne viral diseases 03/16/2020   Current use of long term anticoagulation 01/27/2019   Status post total right knee replacement 07/03/2017   Unilateral primary osteoarthritis, right knee 06/04/2017   Right knee pain 06/04/2017   Unilateral primary osteoarthritis, left knee 10/31/2016   Status post total left knee  replacement 10/31/2016   Pain syndrome, chronic 06/18/2015   Drug therapy 06/18/2015   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 06/18/2015   Rebound headache 06/18/2015   Panic disorder without agoraphobia 06/18/2015   Ulnar artery aneurysm (HCC) 12/18/2013   Numbness and tingling in right hand 11/19/2013   Dyslipidemia 10/09/2013   SLEEP APNEA, OBSTRUCTIVE 12/16/2008   HYPERTENSION 12/16/2008   BACK PAIN, LUMBAR, CHRONIC 12/16/2008   DEEP VENOUS THROMBOPHLEBITIS, HX OF 12/16/2008   MIGRAINES, HX OF 12/16/2008   Other postprocedural status(V45.89) 02/28/2008   Past Medical History:  Diagnosis Date   Arthritis    "wrists, knees, back, shoulders; everywhere" (11/01/2016)   Chronic lower back pain    Daily headache    "recently; related to pain in my left knee" (11/01/2016)   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)    History of kidney stones    last flare up 2017   HTN (hypertension)    Hypertension    Migraine    "might get 1/month now" (11/01/2016)   OA (osteoarthritis) of knee    unilateral primary OA of left knee   Obesity    OSA (obstructive sleep apnea)    "don't wear the mask" (11/01/2016)   Personal history of thrombophlebitis    back in 2016    Family History  Problem Relation Age of Onset   Coronary artery disease Mother        premature   Heart disease Father     Past Surgical History:  Procedure Laterality Date   BACK SURGERY  1987/2005/2009; 03/25/2010; 2012 X 2; ?other dates   "I've had 10 total"   CARPAL TUNNEL RELEASE Bilateral    FOREARM SURGERY Right 2010   blood clots from my wrist to elbow" (11/01/2016)   HAND SURGERY Right 2010   "aneurysm in my palm"   INCISION AND DRAINAGE OF WOUND     lumbar   JOINT REPLACEMENT     LITHOTRIPSY  X 2   LUMBAR DISC SURGERY  ~ 6   LUMBAR SPINE HARDWARE REMOVAL  X 1   POSTERIOR LUMBAR FUSION  X 2   SHOULDER ARTHROSCOPY W/ ROTATOR CUFF REPAIR Right 2010   TOTAL KNEE  ARTHROPLASTY Left 10/31/2016   Procedure: LEFT TOTAL KNEE ARTHROPLASTY;  Surgeon: Kathryne Hitch, MD;  Location: [facility] OR;  Service: Orthopedics;  Laterality: Left;   TOTAL KNEE ARTHROPLASTY Right 07/03/2017   Procedure: RIGHT TOTAL KNEE ARTHROPLASTY;  Surgeon: Kathryne Hitch, MD;  Location: [facility] OR;  Service: Orthopedics;  Laterality: Right;   Social History   Occupational History   Occupation: Truck  driver    Employer: [employer]  Tobacco Use   Smoking status: Never   Smokeless tobacco: Never  Vaping Use   Vaping Use: Never used  Substance and Sexual Activity   Alcohol use: No   Drug use: No   Sexual activity: Yes

## 2022-02-01 ENCOUNTER — Encounter: Payer: Self-pay | Admitting: Internal Medicine

## 2022-02-01 ENCOUNTER — Other Ambulatory Visit: Payer: Self-pay

## 2022-02-01 DIAGNOSIS — G8929 Other chronic pain: Secondary | ICD-10-CM

## 2022-02-14 ENCOUNTER — Telehealth: Payer: Self-pay | Admitting: Orthopaedic Surgery

## 2022-02-14 NOTE — Telephone Encounter (Signed)
Patient called in because he cannot see xrays and results on mychart and would like to know if we can email the xrays to him at

## 2022-02-27 ENCOUNTER — Ambulatory Visit: Payer: Commercial Managed Care - HMO

## 2022-02-27 DIAGNOSIS — G4733 Obstructive sleep apnea (adult) (pediatric): Secondary | ICD-10-CM

## 2022-03-03 DIAGNOSIS — G4733 Obstructive sleep apnea (adult) (pediatric): Secondary | ICD-10-CM

## 2022-03-29 ENCOUNTER — Telehealth: Payer: Self-pay | Admitting: Internal Medicine

## 2022-03-29 DIAGNOSIS — G4733 Obstructive sleep apnea (adult) (pediatric): Secondary | ICD-10-CM

## 2022-03-29 NOTE — Telephone Encounter (Signed)
Patient called to request the results of his sleep test.  Please call patient to discuss at [phone]

## 2022-03-29 NOTE — Telephone Encounter (Signed)
Called and spoke with patient. Patient stated he wanted to know his HST results.   CY, please advise.

## 2022-03-30 NOTE — Telephone Encounter (Signed)
Called and spoke with pt letting him know the results of HST and recs per CY. Pt said he did not want to go with cpap so order for referral to orthodontics has been placed. Nothing further needed.

## 2022-03-30 NOTE — Telephone Encounter (Signed)
Home sleep test showed severe obstructive sleep apnea averaging 67 apneas/ hour with drops in blood oxygen.  I think CPAP would be the best choice and I strongly suggest he try it again with the newer masks and machines. If he agrees- Order new DME, new CPAP auto 5-20, mask of choice, humidifier, supplies, AirView/ card  I would need to see him back in [id number] days per insurance regs  If he refuses CPAP, then Order- referral to Orthodontist Dr Althea Grimmer to consider oral appliance for OSA

## 2022-04-24 DIAGNOSIS — E119 Type 2 diabetes mellitus without complications: Secondary | ICD-10-CM

## 2022-04-24 HISTORY — DX: Type 2 diabetes mellitus without complications: E11.9

## 2022-04-25 ENCOUNTER — Ambulatory Visit: Payer: Commercial Managed Care - HMO | Admitting: Internal Medicine

## 2022-09-12 NOTE — Progress Notes (Signed)
Office Visit Note  Patient: Joshua [name]             Date of Birth: 1971/03/08           MRN: [id number]             PCP: Alinda Deem, MD Referring: Bernerd Pho, MD Visit Date: 09/26/2022 Occupation: @GUAROCC @  Subjective:  Upper and lower back pain  History of Present Illness: Joshua [name] is a 52 y.o. male seen in consultation per request of Dr. Allena Katz for possible ankylosing spondylitis.  According the patient his symptoms started at age 4 as he is he is to load furniture in the truck.  He has had lower back pain since then.  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  In 1987 he had L4-L5 lumbar spine fusion by Dr. Ollen Bowl.  Over time he had multiple surgeries due to ongoing pain and discomfort in his lower back.  He states he has total of 10 surgeries.  He had several surgeries by Dr. [name].  The hardware in his lumbar spine was removed due to ongoing pain and discomfort.  He also had cement placement.  He states despite of having all the surgeries he is in constant pain in the thoracic and lumbar region.  He was recently evaluated by Dr. Allena Katz who was suspicious of ankylosing spondylitis due to his young age of onset of symptoms and also morning stiffness.  He states he was referred to [facility] for most likely thoracic spine surgery.  He continues to have some discomfort in his entire spine.  He has stiffness in the neck.  He also has known history of osteoarthritis in his knee joints and had bilateral total knee replacements in the past.  He also had rotator cuff tear repair of the right shoulder in 2010.  He has chronic pain and discomfort.  He has diarrhea alternating with constipation.  He never had blood in stool.  He has no personal or family history of psoriasis.  There is no family history of ankylosing spondylitis, inflammatory bowel disease or psoriatic arthritis.    Activities of Daily Living:  Patient reports morning stiffness for all day. Patient Reports  nocturnal pain.  Difficulty dressing/grooming: Denies Difficulty climbing stairs: Reports Difficulty getting out of chair: Denies Difficulty using hands for taps, buttons, cutlery, and/or writing: Denies  Review of Systems  Constitutional:  Positive for fatigue.  HENT:  Positive for mouth dryness. Negative for mouth sores.        Mouth breather  Eyes:  Positive for photophobia and dryness.  Respiratory:  Positive for shortness of breath.        Secondary to heart disease per patient.  Cardiovascular:  Negative for chest pain and palpitations.  Gastrointestinal:  Positive for constipation and diarrhea. Negative for blood in stool.  Endocrine: Negative for increased urination.  Genitourinary:  Positive for decreased urine output. Negative for involuntary urination.  Musculoskeletal:  Positive for joint pain, gait problem, joint pain, myalgias, muscle weakness, morning stiffness and myalgias. Negative for joint swelling and muscle tenderness.  Skin:  Negative for color change, rash and sensitivity to sunlight.  Allergic/Immunologic: Negative for susceptible to infections.  Neurological:  Positive for headaches. Negative for dizziness.  Hematological:  Negative for swollen glands.  Psychiatric/Behavioral:  Positive for depressed mood and sleep disturbance. The patient is nervous/anxious.     PMFS History:  Patient Active Problem List   Diagnosis Date Noted   Atypical chest  pain 07/29/2021   Family history of colon cancer requiring screening colonoscopy 03/16/2020   BMI 40.0-44.9, adult (HCC) 03/16/2020   Screening examination for other arthropod-borne viral diseases 03/16/2020   Current use of long term anticoagulation 01/27/2019   Status post total right knee replacement 07/03/2017   Unilateral primary osteoarthritis, right knee 06/04/2017   Right knee pain 06/04/2017   Unilateral primary osteoarthritis, left knee 10/31/2016   Status post total left knee replacement 10/31/2016    Pain syndrome, chronic 06/18/2015   Drug therapy 06/18/2015   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 06/18/2015   Rebound headache 06/18/2015   Panic disorder without agoraphobia 06/18/2015   Ulnar artery aneurysm (HCC) 12/18/2013   Numbness and tingling in right hand 11/19/2013   Dyslipidemia 10/09/2013   SLEEP APNEA, OBSTRUCTIVE 12/16/2008   HYPERTENSION 12/16/2008   BACK PAIN, LUMBAR, CHRONIC 12/16/2008   DEEP VENOUS THROMBOPHLEBITIS, HX OF 12/16/2008   MIGRAINES, HX OF 12/16/2008   Other postprocedural status(V45.89) 02/28/2008    Past Medical History:  Diagnosis Date   Arthritis    "wrists, knees, back, shoulders; everywhere" (11/01/2016)   Chronic lower back pain    Daily headache    "recently; related to pain in my left knee" (11/01/2016)   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)    History of kidney stones    last flare up 2017   HTN (hypertension)    Hypertension    Migraine    "might get 1/month now" (11/01/2016)   OA (osteoarthritis) of knee    unilateral primary OA of left knee   Obesity    OSA (obstructive sleep apnea)    "don't wear the mask" (11/01/2016)   Personal history of thrombophlebitis    back in 2016    Family History  Problem Relation Age of Onset   Hypertension Mother    Cancer Mother    Heart disease Father    COPD Father    Heart disease Brother    Cancer Son    Healthy Son    Healthy Son    Past Surgical History:  Procedure Laterality Date   BACK SURGERY  1987/2005/2009; 03/25/2010; 2012 X 2; ?other dates   "I've had 10 total"   CARPAL TUNNEL RELEASE Bilateral    FOREARM SURGERY Right 2010   blood clots from my wrist to elbow" (11/01/2016)   HAND SURGERY Right 2010   "aneurysm in my palm"   INCISION AND DRAINAGE OF WOUND     lumbar   JOINT REPLACEMENT     LITHOTRIPSY  X 2   LUMBAR DISC SURGERY  ~ 6   LUMBAR SPINE HARDWARE REMOVAL  X 1   POSTERIOR LUMBAR FUSION  X 2   SHOULDER ARTHROSCOPY W/  ROTATOR CUFF REPAIR Right 2010   TOTAL KNEE ARTHROPLASTY Left 10/31/2016   Procedure: LEFT TOTAL KNEE ARTHROPLASTY;  Surgeon: Kathryne Hitch, MD;  Location: [facility] OR;  Service: Orthopedics;  Laterality: Left;   TOTAL KNEE ARTHROPLASTY Right 07/03/2017   Procedure: RIGHT TOTAL KNEE ARTHROPLASTY;  Surgeon: Kathryne Hitch, MD;  Location: [facility] OR;  Service: Orthopedics;  Laterality: Right;   Social History   Social History Narrative   ** Merged History Encounter **       Full time. Married with 3 boys.    Walks everyday    There is no immunization history on file for this patient.   Objective: Vital Signs: BP (!) 143/83 (BP Location: Right Arm, Patient Position: Sitting,  Cuff Size: Large)   Pulse 94   Resp 18   Ht 5\' 11"  (1.803 m)   Wt (!) 308 lb (139.7 kg)   BMI 42.96 kg/m    Physical Exam Vitals and nursing note reviewed.  Constitutional:      Appearance: He is well-developed.  HENT:     Head: Normocephalic and atraumatic.  Eyes:     Conjunctiva/sclera: Conjunctivae normal.     Pupils: Pupils are equal, round, and reactive to light.  Cardiovascular:     Rate and Rhythm: Normal rate and regular rhythm.     Heart sounds: Normal heart sounds.  Pulmonary:     Effort: Pulmonary effort is normal.     Breath sounds: Normal breath sounds.  Abdominal:     General: Bowel sounds are normal.     Palpations: Abdomen is soft.  Musculoskeletal:     Cervical back: Normal range of motion and neck supple.  Skin:    General: Skin is warm and dry.     Capillary Refill: Capillary refill takes less than 2 seconds.  Neurological:     Mental Status: He is alert and oriented to person, place, and time.  Psychiatric:        Behavior: Behavior normal.      Musculoskeletal Exam: Cervical spine was in good range of motion without discomfort.  He had limited range of motion of the thoracic and lumbar spine with discomfort.  He had discomfort range of motion of his right shoulder  joint.  He has some limitation with abduction and internal rotation bilaterally.  Elbow joints, wrist joints, MCPs PIPs and DIPs been good range of motion.  He had bilateral PIP and DIP thickening with no synovitis.  Hip joints were in good range of motion.  Bilateral knee joints were replaced.  There was no tenderness over ankles or MTPs.  He had no Planter fasciitis or Achilles tendinitis.  CDAI Exam: CDAI Score: -- Patient Global: --; Provider Global: -- Swollen: --; Tender: -- Joint Exam 09/26/2022   No joint exam has been documented for this visit   There is currently no information documented on the homunculus. Go to the Rheumatology activity and complete the homunculus joint exam.  Investigation: No additional findings.  Imaging: No results found.  Recent Labs: Lab Results  Component Value Date   WBC 16.3 (H) 07/04/2017   HGB 12.1 (L) 07/04/2017   PLT 201 07/04/2017   NA 139 08/04/2021   K 3.8 08/04/2021   CL 102 08/04/2021   CO2 24 08/04/2021   GLUCOSE 98 08/04/2021   BUN 15 08/04/2021   CREATININE 0.86 08/04/2021   CALCIUM 9.4 08/04/2021   GFRAA >60 07/04/2017    Speciality Comments: No specialty comments available.  Procedures:  No procedures performed Allergies: Patient has no known allergies.   Assessment / Plan:     Visit Diagnoses: Spinal stenosis of thoracolumbar region - CT 03/27/22-severe stenosis. Cannot take NSAIDs due to xarelto. approx. 10 lumbar spine surgeries (1988, 2005, 2008, 2012-Dr. [name]).  Patient gives history of lower back pain since he was 41 as he used to work furniture.  He also played football and drove trucks over the years.  He had a lumbar spine fusion in 1987 at L4-L5 region by Dr. Ollen Bowl.  He had multiple surgeries after that by Dr. [name].  He states he had total of 10 surgeries.  He was recently evaluated by Dr. Allena Katz and there was concern about possible ankylosing spondylitis due to  morning stiffness and ongoing discomfort and  limited mobility in his spine.  I reviewed all previous x-rays of lumbar spine from October 2023, thoracolumbar spine from 2012, hip joint x-rays from September 2022.  X-rays showed postsurgical fusion of the lower lumbar spine.  Multilevel spondylosis with osteophytes and no syndesmophytes were noted.  No squaring of vertebrae was noted.  SI joints were without any narrowing or sclerosis.  Some degenerative changes were noted in the SI joints.  These findings were consistent with degenerative disc disease.  I reviewed all the x-ray findings with the patient.  I do not see any features of ankylosing spondylitis on the radiographic examination.  Patient also had previous MRI of the cervical thoracic and lumbar spine in 2012 which were reviewed.  He has ongoing pain and discomfort despite having multiple surgeries.  He has an appointment coming up with [facility].  Fusion of spine, lumbar region - Morning stiffness lasting 2 hours, progressively worsening low back pain.  X-rays of the lumbar spine were reviewed with the patient.  He had multilevel spondylosis with osteophytes.  No syndesmophytes were noted.  Back surgery 1987, 2005, 2009, 2011  Status post total left knee replacement - 10/31/16 by Dr. Magnus Ivan.  No warmth or swelling was noted.  He has chronic discomfort.  Status post total right knee replacement - 07/03/17 by Dr. Magnus Ivan.  He gives history of chronic discomfort.  S/p right rotator cuff repair-he has limited abduction and internal rotation and chronic pain.  Chronic pain syndrome -patient states that he lives in constant pain 10 out of 10.  I advised referral to pain management.  HYPERTENSION-blood pressure was 143/83 today.  He has been followed by Dr. Bing Matter.  Ulnar artery aneurysm (HCC)-requiring surgery in the past.  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  He is on Xarelto.  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 Xarelto.  Current use of long term anticoagulation - He is on Xarelto.  Other medical problems are listed as follows:  Dyslipidemia  Hx of migraines  Panic disorder without agoraphobia  Nephrolithiasis  Orders: No orders of the defined types were placed in this encounter.  No orders of the defined types were placed in this encounter.    Follow-Up Instructions: Return if symptoms worsen or fail to improve, for LBP.   Pollyann Savoy, MD  Note - This record has been created using Animal nutritionist.  Chart creation errors have been sought, but may not always  have been located. Such creation errors do not reflect on  the standard of medical care.,

## 2022-09-26 ENCOUNTER — Encounter: Payer: Self-pay | Admitting: Rheumatology

## 2022-09-26 ENCOUNTER — Ambulatory Visit: Payer: Commercial Managed Care - HMO | Attending: Rheumatology | Admitting: Rheumatology

## 2022-09-26 VITALS — BP 143/83 | HR 94 | Resp 18 | Ht 71.0 in | Wt 308.0 lb

## 2022-09-26 DIAGNOSIS — G4733 Obstructive sleep apnea (adult) (pediatric): Secondary | ICD-10-CM

## 2022-09-26 DIAGNOSIS — Z8669 Personal history of other diseases of the nervous system and sense organs: Secondary | ICD-10-CM

## 2022-09-26 DIAGNOSIS — F410 Panic disorder [episodic paroxysmal anxiety] without agoraphobia: Secondary | ICD-10-CM

## 2022-09-26 DIAGNOSIS — Z96652 Presence of left artificial knee joint: Secondary | ICD-10-CM | POA: Diagnosis not present

## 2022-09-26 DIAGNOSIS — E785 Hyperlipidemia, unspecified: Secondary | ICD-10-CM

## 2022-09-26 DIAGNOSIS — N200 Calculus of kidney: Secondary | ICD-10-CM

## 2022-09-26 DIAGNOSIS — Z96651 Presence of right artificial knee joint: Secondary | ICD-10-CM

## 2022-09-26 DIAGNOSIS — I10 Essential (primary) hypertension: Secondary | ICD-10-CM

## 2022-09-26 DIAGNOSIS — Z86711 Personal history of pulmonary embolism: Secondary | ICD-10-CM

## 2022-09-26 DIAGNOSIS — M4805 Spinal stenosis, thoracolumbar region: Secondary | ICD-10-CM | POA: Diagnosis not present

## 2022-09-26 DIAGNOSIS — M4326 Fusion of spine, lumbar region: Secondary | ICD-10-CM | POA: Diagnosis not present

## 2022-09-26 DIAGNOSIS — I721 Aneurysm of artery of upper extremity: Secondary | ICD-10-CM

## 2022-09-26 DIAGNOSIS — Z9889 Other specified postprocedural states: Secondary | ICD-10-CM

## 2022-09-26 DIAGNOSIS — G894 Chronic pain syndrome: Secondary | ICD-10-CM

## 2022-09-26 DIAGNOSIS — I119 Hypertensive heart disease without heart failure: Secondary | ICD-10-CM

## 2022-09-26 DIAGNOSIS — Z7901 Long term (current) use of anticoagulants: Secondary | ICD-10-CM

## 2022-09-26 DIAGNOSIS — Z86718 Personal history of other venous thrombosis and embolism: Secondary | ICD-10-CM

## 2022-10-03 NOTE — Progress Notes (Deleted)
Office Visit Note  Patient: Joshua [name]             Date of Birth: [birth year]           MRN: [id number]             PCP: Alinda Deem, MD Referring: Alinda Deem, MD Visit Date: 10/17/2022 Occupation: @GUAROCC @  Subjective:  No chief complaint on file.   History of Present Illness: Joshua [name] is a 52 y.o. male ***     Activities of Daily Living:  Patient reports morning stiffness for *** {minute/hour:19697}.   Patient {ACTIONS;DENIES/REPORTS:21021675::"Denies"} nocturnal pain.  Difficulty dressing/grooming: {ACTIONS;DENIES/REPORTS:21021675::"Denies"} Difficulty climbing stairs: {ACTIONS;DENIES/REPORTS:21021675::"Denies"} Difficulty getting out of chair: {ACTIONS;DENIES/REPORTS:21021675::"Denies"} Difficulty using hands for taps, buttons, cutlery, and/or writing: {ACTIONS;DENIES/REPORTS:21021675::"Denies"}  No Rheumatology ROS completed.   PMFS History:  Patient Active Problem List   Diagnosis Date Noted   Atypical chest pain 07/29/2021   Family history of colon cancer requiring screening colonoscopy 03/16/2020   BMI 40.0-44.9, adult (HCC) 03/16/2020   Screening examination for other arthropod-borne viral diseases 03/16/2020   Current use of long term anticoagulation 01/27/2019   Status post total right knee replacement 07/03/2017   Unilateral primary osteoarthritis, right knee 06/04/2017   Right knee pain 06/04/2017   Unilateral primary osteoarthritis, left knee 10/31/2016   Status post total left knee replacement 10/31/2016   Pain syndrome, chronic 06/18/2015   Drug therapy 06/18/2015   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 06/18/2015   Rebound headache 06/18/2015   Panic disorder without agoraphobia 06/18/2015   Ulnar artery aneurysm (HCC) 12/18/2013   Numbness and tingling in right hand 11/19/2013   Dyslipidemia 10/09/2013   SLEEP APNEA, OBSTRUCTIVE 12/16/2008   HYPERTENSION 12/16/2008   BACK PAIN, LUMBAR, CHRONIC  12/16/2008   DEEP VENOUS THROMBOPHLEBITIS, HX OF 12/16/2008   MIGRAINES, HX OF 12/16/2008   Other postprocedural status(V45.89) 02/28/2008    Past Medical History:  Diagnosis Date   Arthritis    "wrists, knees, back, shoulders; everywhere" (11/01/2016)   Chronic lower back pain    Daily headache    "recently; related to pain in my left knee" (11/01/2016)   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)    History of kidney stones    last flare up 2017   HTN (hypertension)    Hypertension    Migraine    "might get 1/month now" (11/01/2016)   OA (osteoarthritis) of knee    unilateral primary OA of left knee   Obesity    OSA (obstructive sleep apnea)    "don't wear the mask" (11/01/2016)   Personal history of thrombophlebitis    back in 2016    Family History  Problem Relation Age of Onset   Hypertension Mother    Cancer Mother    Heart disease Father    COPD Father    Heart disease Brother    Cancer Son    Healthy Son    Healthy Son    Past Surgical History:  Procedure Laterality Date   BACK SURGERY  1987/2005/2009; 03/25/2010; 2012 X 2; ?other dates   "I've had 10 total"   CARPAL TUNNEL RELEASE Bilateral    FOREARM SURGERY Right 2010   blood clots from my wrist to elbow" (11/01/2016)   HAND SURGERY Right 2010   "aneurysm in my palm"   INCISION AND DRAINAGE OF WOUND     lumbar   JOINT REPLACEMENT     LITHOTRIPSY  X 2   LUMBAR  DISC SURGERY  ~ 6   LUMBAR SPINE HARDWARE REMOVAL  X 1   POSTERIOR LUMBAR FUSION  X 2   SHOULDER ARTHROSCOPY W/ ROTATOR CUFF REPAIR Right 2010   TOTAL KNEE ARTHROPLASTY Left 10/31/2016   Procedure: LEFT TOTAL KNEE ARTHROPLASTY;  Surgeon: Kathryne Hitch, MD;  Location: [facility];  Service: Orthopedics;  Laterality: Left;   TOTAL KNEE ARTHROPLASTY Right 07/03/2017   Procedure: RIGHT TOTAL KNEE ARTHROPLASTY;  Surgeon: Kathryne Hitch, MD;  Location: [facility];  Service: Orthopedics;  Laterality: Right;   Social History   Social  History Narrative   ** Merged History Encounter **       Full time. Married with 3 boys.    Walks everyday    There is no immunization history on file for this patient.   Objective: Vital Signs: There were no vitals taken for this visit.   Physical Exam   Musculoskeletal Exam: ***  CDAI Exam: CDAI Score: -- Patient Global: --; Provider Global: -- Swollen: --; Tender: -- Joint Exam 10/17/2022   No joint exam has been documented for this visit   There is currently no information documented on the homunculus. Go to the Rheumatology activity and complete the homunculus joint exam.  Investigation: No additional findings.  Imaging: No results found.  Recent Labs: Lab Results  Component Value Date   WBC 16.3 (H) 07/04/2017   HGB 12.1 (L) 07/04/2017   PLT 201 07/04/2017   NA 139 08/04/2021   K 3.8 08/04/2021   CL 102 08/04/2021   CO2 24 08/04/2021   GLUCOSE 98 08/04/2021   BUN 15 08/04/2021   CREATININE 0.86 08/04/2021   CALCIUM 9.4 08/04/2021   GFRAA >60 07/04/2017    Speciality Comments: No specialty comments available.  Procedures:  No procedures performed Allergies: Patient has no known allergies.   Assessment / Plan:     Visit Diagnoses: No diagnosis found.  Orders: No orders of the defined types were placed in this encounter.  No orders of the defined types were placed in this encounter.   Face-to-face time spent with patient was *** minutes. Greater than 50% of time was spent in counseling and coordination of care.  Follow-Up Instructions: No follow-ups on file.   Pollyann Savoy, MD  Note - This record has been created using Animal nutritionist.  Chart creation errors have been sought, but may not always  have been located. Such creation errors do not reflect on  the standard of medical care.

## 2022-10-16 ENCOUNTER — Telehealth: Payer: Self-pay | Admitting: Orthopaedic Surgery

## 2022-10-16 NOTE — Telephone Encounter (Signed)
Patient states that his other hip hurting but I told him that we would need to see him to see what is going on

## 2022-10-16 NOTE — Telephone Encounter (Signed)
Pt called requesting a call from [facility]. Pt states his jhip hurt and can't make an appt at this time but need medical advice. Please call pt at [phone].

## 2022-10-17 ENCOUNTER — Ambulatory Visit: Payer: Commercial Managed Care - HMO | Admitting: Rheumatology

## 2022-11-06 ENCOUNTER — Other Ambulatory Visit (INDEPENDENT_AMBULATORY_CARE_PROVIDER_SITE_OTHER): Payer: Commercial Managed Care - HMO

## 2022-11-06 ENCOUNTER — Encounter: Payer: Self-pay | Admitting: Surgical

## 2022-11-06 ENCOUNTER — Other Ambulatory Visit: Payer: Self-pay

## 2022-11-06 ENCOUNTER — Ambulatory Visit: Payer: Commercial Managed Care - HMO | Admitting: Surgical

## 2022-11-06 DIAGNOSIS — M25552 Pain in left hip: Secondary | ICD-10-CM | POA: Diagnosis not present

## 2022-11-06 DIAGNOSIS — M79605 Pain in left leg: Secondary | ICD-10-CM | POA: Diagnosis not present

## 2022-11-06 MED ORDER — LIDOCAINE HCL 1 % IJ SOLN
15.0000 mL | INTRAMUSCULAR | Status: AC | PRN
Start: 2022-11-06 — End: 2022-11-06
  Administered 2022-11-06: 15 mL

## 2022-11-06 NOTE — Progress Notes (Signed)
Office Visit Note   Patient: Joshua [name]           Date of Birth: 1971-04-14           MRN: [id number] Visit Date: 11/06/2022 Requested by: Alinda Deem, MD [address] [place],  [place] [address] PCP: Alinda Deem, MD  Subjective: Chief Complaint  Patient presents with   Left Leg - Pain    HPI: Joshua [name] is a 52 y.o. male who presents to the office reporting low back pain and left hip pain.  Patient has history of 10 prior back surgeries primarily by Dr. [name].  He has had fusions throughout the lumbar spine and lower thoracic spine with hardware removal.  However, over the last month his back pain has mostly resolved for the time being but now he has new hip pain that he has never had before.  Localizes pain to the groin as well as to the buttock region and lateral hip with radiation down to the posterior aspect of the knee.  He has no history of prior hip pain or surgeries.  No stiffness in his hip.  No mechanical symptoms in the hip.  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  He works as a Naval architect.  Takes occasional Tylenol.  Has history of bilateral total knee arthroplasty by Dr. Magnus Ivan.  No history of diabetes or smoking.  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  Pain will occasionally wake him up from sleep at night..                ROS: All systems reviewed are negative as they relate to the chief complaint within the history of present illness.  Patient denies fevers or chills.  Assessment & Plan: Visit Diagnoses:  1. Pain in left hip   2. Pain in left leg     Plan: Patient is a 52 year old male who presents for evaluation of primarily left hip pain.  Has history of low back pain that has bothered him for about 30 years.  He has had about 10 back surgeries primarily by Dr. [name] but over the last month he has  had increase in his pain in his hip that he is really never had before.  Based on his description, seems that it may be more hip related than back related though this is a possibility especially with the significant degenerative changes that are present at L1-L2 on today's radiographs.  Given his increased limp and reproducibility of groin pain with hip range of motion, ultrasound was utilized to administer 10 cc of lidocaine into the left hip joint.  This gave him about 70% relief of his symptoms walking around the clinic and with reevaluation with FADIR sign and Stinchfield sign.  Based on this response, plan to further evaluate with MRI of the left hip to evaluate for occult hip arthritis versus avascular necrosis.  No evidence of significantly decreased density of the femoral head/neck on today's radiographs to suggest transient osteoporosis  Follow-Up Instructions: No follow-ups on file.   Orders:  Orders Placed This Encounter  Procedures   XR Lumbar Spine 2-3 Views   XR HIP UNILAT W OR W/O PELVIS 2-3 VIEWS LEFT   US Guided Needle Placement - No Linked Charges   MR Hip Left w/o contrast  No orders of the defined types were placed in this encounter.     Procedures: Large Joint Inj: L hip joint on 11/06/2022 11:52 AM Indications: pain and diagnostic evaluation Details: 22 G 3.5 in needle, ultrasound-guided lateral approach  Arthrogram: No  Medications: 15 mL lidocaine 1 % Outcome: tolerated well, no immediate complications Procedure, treatment alternatives, risks and benefits explained, specific risks discussed. Consent was given by the patient. Immediately prior to procedure a time out was called to verify the correct patient, procedure, equipment, support staff and site/side marked as required. Patient was prepped and draped in the usual sterile fashion.       Clinical Data: No additional findings.  Objective: Vital Signs: There were no vitals taken for this visit.  Physical  Exam:  Constitutional: Patient appears well-developed HEENT:  Head: Normocephalic Eyes:EOM are normal Neck: Normal range of motion Cardiovascular: Normal rate Pulmonary/chest: Effort normal Neurologic: Patient is alert Skin: Skin is warm Psychiatric: Patient has normal mood and affect  Ortho Exam: Ortho exam demonstrates intact hip flexion, quadricep, hamstring, dorsiflexion, plantarflexion, EHL rated 5/5.  Hip flexion does reproduce some pain.  He has increased pain with straight leg raise in his back but not his groin or leg.  He has positive Stinchfield sign reproducing groin pain with a little bit of weakness.  He has no tenderness over the trochanter.  No clonus bilaterally.  Positive FADIR sign reproducing groin pain.  He ambulates with slight Trendelenburg gait.  Specialty Comments:  No specialty comments available.  Imaging: No results found.   PMFS History: Patient Active Problem List   Diagnosis Date Noted   Atypical chest pain 07/29/2021   Family history of colon cancer requiring screening colonoscopy 03/16/2020   BMI 40.0-44.9, adult (HCC) 03/16/2020   Screening examination for other arthropod-borne viral diseases 03/16/2020   Current use of long term anticoagulation 01/27/2019   Status post total right knee replacement 07/03/2017   Unilateral primary osteoarthritis, right knee 06/04/2017   Right knee pain 06/04/2017   Unilateral primary osteoarthritis, left knee 10/31/2016   Status post total left knee replacement 10/31/2016   Pain syndrome, chronic 06/18/2015   Drug therapy 06/18/2015   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 06/18/2015   Rebound headache 06/18/2015   Panic disorder without agoraphobia 06/18/2015   Ulnar artery aneurysm (HCC) 12/18/2013   Numbness and tingling in right hand 11/19/2013   Dyslipidemia 10/09/2013   SLEEP APNEA, OBSTRUCTIVE 12/16/2008   HYPERTENSION 12/16/2008   BACK PAIN, LUMBAR, CHRONIC 12/16/2008    DEEP VENOUS THROMBOPHLEBITIS, HX OF 12/16/2008   MIGRAINES, HX OF 12/16/2008   Other postprocedural status(V45.89) 02/28/2008   Past Medical History:  Diagnosis Date   Arthritis    "wrists, knees, back, shoulders; everywhere" (11/01/2016)   Chronic lower back pain    Daily headache    "recently; related to pain in my left knee" (11/01/2016)   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)    History of kidney stones    last flare up 2017   HTN (hypertension)    Hypertension    Migraine    "might get 1/month now" (11/01/2016)   OA (osteoarthritis) of knee    unilateral primary OA of left knee   Obesity    OSA (obstructive sleep apnea)    "don't wear the mask" (11/01/2016)   Personal history of thrombophlebitis    back in 2016    Family History  Problem Relation Age of Onset   Hypertension Mother  Cancer Mother    Heart disease Father    COPD Father    Heart disease Brother    Cancer Son    Healthy Son    Healthy Son     Past Surgical History:  Procedure Laterality Date   BACK SURGERY  1987/2005/2009; 03/25/2010; 2012 X 2; ?other dates   "I've had 10 total"   CARPAL TUNNEL RELEASE Bilateral    FOREARM SURGERY Right 2010   blood clots from my wrist to elbow" (11/01/2016)   HAND SURGERY Right 2010   "aneurysm in my palm"   INCISION AND DRAINAGE OF WOUND     lumbar   JOINT REPLACEMENT     LITHOTRIPSY  X 2   LUMBAR DISC SURGERY  ~ 6   LUMBAR SPINE HARDWARE REMOVAL  X 1   POSTERIOR LUMBAR FUSION  X 2   SHOULDER ARTHROSCOPY W/ ROTATOR CUFF REPAIR Right 2010   TOTAL KNEE ARTHROPLASTY Left 10/31/2016   Procedure: LEFT TOTAL KNEE ARTHROPLASTY;  Surgeon: Kathryne Hitch, MD;  Location: [facility];  Service: Orthopedics;  Laterality: Left;   TOTAL KNEE ARTHROPLASTY Right 07/03/2017   Procedure: RIGHT TOTAL KNEE ARTHROPLASTY;  Surgeon: Kathryne Hitch, MD;  Location: [facility];  Service: Orthopedics;  Laterality: Right;   Social History   Occupational History    Occupation: Truck Air traffic controller: [employer]  Tobacco Use   Smoking status: Never    Passive exposure: Past   Smokeless tobacco: Never  Vaping Use   Vaping status: Never Used  Substance and Sexual Activity   Alcohol use: No   Drug use: No   Sexual activity: Yes

## 2022-11-07 ENCOUNTER — Encounter: Payer: Commercial Managed Care - HMO | Admitting: Rheumatology

## 2022-11-13 NOTE — Telephone Encounter (Signed)
Patient called and wanted to know why he hasn't received a call to schedule his MRI yet.

## 2022-11-20 ENCOUNTER — Other Ambulatory Visit: Payer: Commercial Managed Care - HMO

## 2022-11-27 ENCOUNTER — Ambulatory Visit: Payer: Commercial Managed Care - HMO | Admitting: Orthopedic Surgery

## 2022-12-05 ENCOUNTER — Ambulatory Visit: Payer: Commercial Managed Care - HMO | Admitting: Rheumatology

## 2022-12-11 ENCOUNTER — Other Ambulatory Visit: Payer: Self-pay

## 2022-12-11 ENCOUNTER — Ambulatory Visit: Payer: Commercial Managed Care - HMO | Admitting: Surgical

## 2022-12-11 DIAGNOSIS — M25552 Pain in left hip: Secondary | ICD-10-CM | POA: Diagnosis not present

## 2022-12-11 DIAGNOSIS — M25452 Effusion, left hip: Secondary | ICD-10-CM

## 2022-12-12 ENCOUNTER — Encounter: Payer: Self-pay | Admitting: Surgical

## 2022-12-12 MED ORDER — LIDOCAINE HCL 1 % IJ SOLN
5.0000 mL | INTRAMUSCULAR | Status: AC | PRN
Start: 2022-12-11 — End: 2022-12-11
  Administered 2022-12-11: 5 mL

## 2022-12-12 MED ORDER — BUPIVACAINE HCL 0.25 % IJ SOLN
4.0000 mL | INTRAMUSCULAR | Status: AC | PRN
Start: 2022-12-11 — End: 2022-12-11
  Administered 2022-12-11: 4 mL via INTRA_ARTICULAR

## 2022-12-12 NOTE — Progress Notes (Signed)
Follow-up Office Visit Note   Patient: Joshua [name]           Date of Birth: 09-Jun-1970           MRN: [id number] Visit Date: 12/11/2022 Requested by: Alinda Deem, MD [address] [place],  [place] [address] PCP: Alinda Deem, MD  Subjective: Chief Complaint  Patient presents with   Left Hip - Pain    HPI: Joshua [name] is a 52 y.o. male who returns to the office for follow-up visit.    Plan at last visit was: Patient is a 52 year old male who presents for evaluation of primarily left hip pain. Has history of low back pain that has bothered him for about 30 years. He has had about 10 back surgeries primarily by Dr. [name] but over the last month he has had increase in his pain in his hip that he is really never had before. Based on his description, seems that it may be more hip related than back related though this is a possibility especially with the significant degenerative changes that are present at L1-L2 on today's radiographs. Given his increased limp and reproducibility of groin pain with hip range of motion, ultrasound was utilized to administer 10 cc of lidocaine into the left hip joint. This gave him about 70% relief of his symptoms walking around the clinic and with reevaluation with FADIR sign and Stinchfield sign. Based on this response, plan to further evaluate with MRI of the left hip to evaluate for occult hip arthritis versus avascular necrosis. No evidence of significantly decreased density of the femoral head/neck on today's radiographs to suggest transient osteoporosis   Since then, patient notes he has had continued progression of hip pain.  Pain now significant limits his mobility and wakes him up from sleep at night.  He is limping more and more.  Has had to miss work in the last week due to the severity of pain.  He is self limiting how much he is walking.  No medication is helping.  He cannot take NSAIDs due to his history of using Xarelto for prior  DVTs.  MRI was ordered at his last visit but denied by insurance.  Localizes the vast majority of his pain to his hip.  Back really not bothering him currently.              ROS: All systems reviewed are negative as they relate to the chief complaint within the history of present illness.  Patient denies fevers or chills.  Assessment & Plan: Visit Diagnoses:  1. Pain in left hip     Plan: Joshua [name] is a 52 y.o. male who returns to the office for follow-up visit.  Plan from last visit was noted above in HPI.  They now return with no improvement in his hip pain despite symptoms ongoing for greater than 2 months.  Hip pain has significantly worsened and now pretty much keeps him up every night.  He is only able to get about 2 to 3 hours of sleep every night.  He has had to miss work due to the severity of his pain.  Had great relief from lidocaine injection administered at last visit demonstrating that this is most likely pathology stemming from the hip though his radiographs were negative for any significant problem to explain his symptoms.  With worsening symptoms and worsening Trendelenburg gait, need stat MRI to rule out hip stress reaction/fracture versus avascular necrosis.  May have occult arthritis but before we can treat with cortisone injection or something similar, need to make sure that this is not a potentially operative problem.  Order MRI of the left hip.  A Toradol injection was administered into the hip joint today for symptomatic relief but this will likely be short-lived and is only meant to give him some temporary comfort as we get the MRI set up.  Follow-Up Instructions: No follow-ups on file.   Orders:  Orders Placed This Encounter  Procedures   US Guided Needle Placement - No Linked Charges   MR Hip Left w/o contrast   No orders of the defined types were placed in this encounter.     Procedures: Large Joint Inj: L hip joint on 12/11/2022 7:11 AM Indications: pain and  diagnostic evaluation Details: 22 G 3.5 in needle, ultrasound-guided anterior approach  Arthrogram: No  Medications: 5 mL lidocaine 1 %; 4 mL bupivacaine 0.25 % Outcome: tolerated well, no immediate complications  Bupivacaine mixed with 1 cc Toradol Procedure, treatment alternatives, risks and benefits explained, specific risks discussed. Consent was given by the patient. Immediately prior to procedure a time out was called to verify the correct patient, procedure, equipment, support staff and site/side marked as required. Patient was prepped and draped in the usual sterile fashion.       Clinical Data: No additional findings.  Objective: Vital Signs: There were no vitals taken for this visit.  Physical Exam:  Constitutional: Patient appears well-developed HEENT:  Head: Normocephalic Eyes:EOM are normal Neck: Normal range of motion Cardiovascular: Normal rate Pulmonary/chest: Effort normal Neurologic: Patient is alert Skin: Skin is warm Psychiatric: Patient has normal mood and affect  Ortho Exam: Ortho exam demonstrates left hip with positive FADIR sign.  Positive Stinchfield sign reproducing groin pain.  Ambulates with significant Trendelenburg gait.  Negative straight leg raise.  Intact hip flexion actively.  No calf tenderness.  Negative Homans' sign bilaterally.  Specialty Comments:  No specialty comments available.  Imaging: No results found.   PMFS History: Patient Active Problem List   Diagnosis Date Noted   Atypical chest pain 07/29/2021   Family history of colon cancer requiring screening colonoscopy 03/16/2020   BMI 40.0-44.9, adult (HCC) 03/16/2020   Screening examination for other arthropod-borne viral diseases 03/16/2020   Current use of long term anticoagulation 01/27/2019   Status post total right knee replacement 07/03/2017   Unilateral primary osteoarthritis, right knee 06/04/2017   Right knee pain 06/04/2017   Unilateral primary osteoarthritis,  left knee 10/31/2016   Status post total left knee replacement 10/31/2016   Pain syndrome, chronic 06/18/2015   Drug therapy 06/18/2015   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 06/18/2015   Rebound headache 06/18/2015   Panic disorder without agoraphobia 06/18/2015   Ulnar artery aneurysm (HCC) 12/18/2013   Numbness and tingling in right hand 11/19/2013   Dyslipidemia 10/09/2013   SLEEP APNEA, OBSTRUCTIVE 12/16/2008   HYPERTENSION 12/16/2008   BACK PAIN, LUMBAR, CHRONIC 12/16/2008   DEEP VENOUS THROMBOPHLEBITIS, HX OF 12/16/2008   MIGRAINES, HX OF 12/16/2008   Other postprocedural status(V45.89) 02/28/2008   Past Medical History:  Diagnosis Date   Arthritis    "wrists, knees, back, shoulders; everywhere" (11/01/2016)   Chronic lower back pain    Daily headache    "recently; related to pain in my left knee" (11/01/2016)   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)    History of kidney stones    last flare up  2017   HTN (hypertension)    Hypertension    Migraine    "might get 1/month now" (11/01/2016)   OA (osteoarthritis) of knee    unilateral primary OA of left knee   Obesity    OSA (obstructive sleep apnea)    "don't wear the mask" (11/01/2016)   Personal history of thrombophlebitis    back in 2016    Family History  Problem Relation Age of Onset   Hypertension Mother    Cancer Mother    Heart disease Father    COPD Father    Heart disease Brother    Cancer Son    Healthy Son    Healthy Son     Past Surgical History:  Procedure Laterality Date   BACK SURGERY  1987/2005/2009; 03/25/2010; 2012 X 2; ?other dates   "I've had 10 total"   CARPAL TUNNEL RELEASE Bilateral    FOREARM SURGERY Right 2010   blood clots from my wrist to elbow" (11/01/2016)   HAND SURGERY Right 2010   "aneurysm in my palm"   INCISION AND DRAINAGE OF WOUND     lumbar   JOINT REPLACEMENT     LITHOTRIPSY  X 2   LUMBAR DISC SURGERY  ~ 6   LUMBAR SPINE  HARDWARE REMOVAL  X 1   POSTERIOR LUMBAR FUSION  X 2   SHOULDER ARTHROSCOPY W/ ROTATOR CUFF REPAIR Right 2010   TOTAL KNEE ARTHROPLASTY Left 10/31/2016   Procedure: LEFT TOTAL KNEE ARTHROPLASTY;  Surgeon: Kathryne Hitch, MD;  Location: [facility];  Service: Orthopedics;  Laterality: Left;   TOTAL KNEE ARTHROPLASTY Right 07/03/2017   Procedure: RIGHT TOTAL KNEE ARTHROPLASTY;  Surgeon: Kathryne Hitch, MD;  Location: [facility];  Service: Orthopedics;  Laterality: Right;   Social History   Occupational History   Occupation: Truck Air traffic controller: [employer]  Tobacco Use   Smoking status: Never    Passive exposure: Past   Smokeless tobacco: Never  Vaping Use   Vaping status: Never Used  Substance and Sexual Activity   Alcohol use: No   Drug use: No   Sexual activity: Yes

## 2022-12-13 ENCOUNTER — Telehealth: Payer: Self-pay | Admitting: Orthopedic Surgery

## 2022-12-13 NOTE — Telephone Encounter (Signed)
Just contacted pt to let him know the authorization was completed today with approval and I am going to send the request back to [facility] to get scheduled. Also, gave the pt imaging number in case he hasn't heard in next few days to schedule.

## 2022-12-13 NOTE — Telephone Encounter (Signed)
Will see what the results show I just sent him a message.  Thanks Autumn.

## 2022-12-13 NOTE — Telephone Encounter (Signed)
Patient called he haven't heard anything back yet from the MRI place. Patient stated that you was trying to put a rush on it. CB#[phone]

## 2022-12-13 NOTE — Telephone Encounter (Signed)
Do you want to work in?

## 2022-12-14 ENCOUNTER — Telehealth: Payer: Self-pay | Admitting: Surgical

## 2022-12-14 ENCOUNTER — Ambulatory Visit
Admission: RE | Admit: 2022-12-14 | Discharge: 2022-12-14 | Disposition: A | Discharge status: Home / Self Care | Payer: Commercial Managed Care - HMO | Source: Ambulatory Visit | Attending: Surgical | Admitting: Surgical

## 2022-12-14 DIAGNOSIS — M25552 Pain in left hip: Secondary | ICD-10-CM

## 2022-12-14 NOTE — Telephone Encounter (Signed)
Okay for anytime tomorrow morning

## 2022-12-14 NOTE — Telephone Encounter (Signed)
Patient called and said that Joshua [name] told him to get on the schedule for tomorrow to go over MRI results. CB#[phone]

## 2022-12-14 NOTE — Telephone Encounter (Signed)
FYI, called and worked in at [facility] [employer] on Dr. Alfonso Patten schedule

## 2022-12-14 NOTE — Progress Notes (Signed)
I called patient.  Left voicemail about results.  No fracture.  He has more arthritis than suggested by his plain radiographs.  Recommended that he call the office and we can get him on the schedule for a intra-articular hip injection.  I can do this tomorrow morning or afternoon but from my end preferably in the morning would be better.

## 2022-12-14 NOTE — Telephone Encounter (Signed)
Perfect, MRI done this morning. I called and they should be reading the report asap

## 2022-12-15 ENCOUNTER — Other Ambulatory Visit: Payer: Self-pay

## 2022-12-15 ENCOUNTER — Ambulatory Visit: Payer: Commercial Managed Care - HMO | Admitting: Orthopedic Surgery

## 2022-12-15 DIAGNOSIS — M25552 Pain in left hip: Secondary | ICD-10-CM | POA: Diagnosis not present

## 2022-12-15 DIAGNOSIS — M1612 Unilateral primary osteoarthritis, left hip: Secondary | ICD-10-CM

## 2022-12-17 ENCOUNTER — Encounter: Payer: Self-pay | Admitting: Orthopedic Surgery

## 2022-12-17 MED ORDER — BUPIVACAINE HCL 0.25 % IJ SOLN
4.0000 mL | INTRAMUSCULAR | Status: AC | PRN
Start: 2022-12-15 — End: 2022-12-15
  Administered 2022-12-15: 4 mL via INTRA_ARTICULAR

## 2022-12-17 MED ORDER — LIDOCAINE HCL 1 % IJ SOLN
5.0000 mL | INTRAMUSCULAR | Status: AC | PRN
Start: 2022-12-15 — End: 2022-12-15
  Administered 2022-12-15: 5 mL

## 2022-12-17 MED ORDER — METHYLPREDNISOLONE ACETATE 80 MG/ML IJ SUSP
80.0000 mg | INTRAMUSCULAR | Status: AC | PRN
Start: 2022-12-15 — End: 2022-12-15
  Administered 2022-12-15: 80 mg via INTRA_ARTICULAR

## 2022-12-17 NOTE — Progress Notes (Signed)
Office Visit Note   Patient: Joshua [name]           Date of Birth: 1970-11-12           MRN: [id number] Visit Date: 12/15/2022 Requested by: Alinda Deem, MD [address] [place],  [place] [address] PCP: Alinda Deem, MD  Subjective: Chief Complaint  Patient presents with   Other     Review MRI    HPI: Joshua [name] is a 52 y.o. male who presents to the office reporting left hip pain.  Since he was last seen he had an MRI scan of the left hip.  Does have a history of multiple back surgeries as well as epidural steroid injections in his back.  Describes pain both in the groin as well as in the trochanteric region on that left-hand side.  MRI scan shows mild to moderate thinning of the superior lateral left acetabular cartilage greatest in the anterior compartment.  No cam type deformity of the left femoral head neck junction.  There is also some moderate left common hamstring origin tendinosis without tearing..                ROS: All systems reviewed are negative as they relate to the chief complaint within the history of present illness.  Patient denies fevers or chills.  Assessment & Plan: Visit Diagnoses:  1. Pain in left hip     Plan: Impression is left hip pain with possible back source contributing.  Did have intra-articular Toradol/bupivacaine injection which gave an 3 to 4 days of very good relief.  Would like to try cortisone injection in the hip today to see if we can continue that type of pain relief.  Not too much effusion in the left hip joint today on MRI scan or by ultrasound.  He will follow-up with [place] as needed.  Follow-Up Instructions: No follow-ups on file.   Orders:  Orders Placed This Encounter  Procedures   US Guided Needle Placement - No Linked Charges   No orders of the defined types were placed in this encounter.     Procedures: Large Joint Inj: L hip joint on 12/15/2022 1:15 PM Indications: pain and diagnostic  evaluation Details: 22 G 3.5 in needle, ultrasound-guided lateral approach  Arthrogram: No  Medications: 5 mL lidocaine 1 %; 80 mg methylPREDNISolone acetate 80 MG/ML; 4 mL bupivacaine 0.25 % Outcome: tolerated well, no immediate complications Procedure, treatment alternatives, risks and benefits explained, specific risks discussed. Consent was given by the patient. Immediately prior to procedure a time out was called to verify the correct patient, procedure, equipment, support staff and site/side marked as required. Patient was prepped and draped in the usual sterile fashion.       Clinical Data: No additional findings.  Objective: Vital Signs: There were no vitals taken for this visit.  Physical Exam:  Constitutional: Patient appears well-developed HEENT:  Head: Normocephalic Eyes:EOM are normal Neck: Normal range of motion Cardiovascular: Normal rate Pulmonary/chest: Effort normal Neurologic: Patient is alert Skin: Skin is warm Psychiatric: Patient has normal mood and affect  Ortho Exam: Ortho exam demonstrates no Trendelenburg gait.  Mild groin pain bilaterally with internal and external rotation of the hip.  Pedal pulses palpable.  No masses lymphadenopathy or skin changes noted in the hip region.  Minimal discrete trochanteric tenderness on either side.  No discrete tenderness over the ischial tuberosity attachment of the hamstring tendons bilaterally.  Specialty Comments:  No specialty comments  available.  Imaging: No results found.   PMFS History: Patient Active Problem List   Diagnosis Date Noted   Atypical chest pain 07/29/2021   Family history of colon cancer requiring screening colonoscopy 03/16/2020   BMI 40.0-44.9, adult (HCC) 03/16/2020   Screening examination for other arthropod-borne viral diseases 03/16/2020   Current use of long term anticoagulation 01/27/2019   Status post total right knee replacement 07/03/2017   Unilateral primary osteoarthritis,  right knee 06/04/2017   Right knee pain 06/04/2017   Unilateral primary osteoarthritis, left knee 10/31/2016   Status post total left knee replacement 10/31/2016   Pain syndrome, chronic 06/18/2015   Drug therapy 06/18/2015   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 06/18/2015   Rebound headache 06/18/2015   Panic disorder without agoraphobia 06/18/2015   Ulnar artery aneurysm (HCC) 12/18/2013   Numbness and tingling in right hand 11/19/2013   Dyslipidemia 10/09/2013   SLEEP APNEA, OBSTRUCTIVE 12/16/2008   HYPERTENSION 12/16/2008   BACK PAIN, LUMBAR, CHRONIC 12/16/2008   DEEP VENOUS THROMBOPHLEBITIS, HX OF 12/16/2008   MIGRAINES, HX OF 12/16/2008   Other postprocedural status(V45.89) 02/28/2008   Past Medical History:  Diagnosis Date   Arthritis    "wrists, knees, back, shoulders; everywhere" (11/01/2016)   Chronic lower back pain    Daily headache    "recently; related to pain in my left knee" (11/01/2016)   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)    History of kidney stones    last flare up 2017   HTN (hypertension)    Hypertension    Migraine    "might get 1/month now" (11/01/2016)   OA (osteoarthritis) of knee    unilateral primary OA of left knee   Obesity    OSA (obstructive sleep apnea)    "don't wear the mask" (11/01/2016)   Personal history of thrombophlebitis    back in 2016    Family History  Problem Relation Age of Onset   Hypertension Mother    Cancer Mother    Heart disease Father    COPD Father    Heart disease Brother    Cancer Son    Healthy Son    Healthy Son     Past Surgical History:  Procedure Laterality Date   BACK SURGERY  1987/2005/2009; 03/25/2010; 2012 X 2; ?other dates   "I've had 10 total"   CARPAL TUNNEL RELEASE Bilateral    FOREARM SURGERY Right 2010   blood clots from my wrist to elbow" (11/01/2016)   HAND SURGERY Right 2010   "aneurysm in my palm"   INCISION AND DRAINAGE OF WOUND     lumbar    JOINT REPLACEMENT     LITHOTRIPSY  X 2   LUMBAR DISC SURGERY  ~ 6   LUMBAR SPINE HARDWARE REMOVAL  X 1   POSTERIOR LUMBAR FUSION  X 2   SHOULDER ARTHROSCOPY W/ ROTATOR CUFF REPAIR Right 2010   TOTAL KNEE ARTHROPLASTY Left 10/31/2016   Procedure: LEFT TOTAL KNEE ARTHROPLASTY;  Surgeon: Kathryne Hitch, MD;  Location: [facility] OR;  Service: Orthopedics;  Laterality: Left;   TOTAL KNEE ARTHROPLASTY Right 07/03/2017   Procedure: RIGHT TOTAL KNEE ARTHROPLASTY;  Surgeon: Kathryne Hitch, MD;  Location: [facility] OR;  Service: Orthopedics;  Laterality: Right;   Social History   Occupational History   Occupation: Truck Air traffic controller: [employer]  Tobacco Use   Smoking status: Never    Passive exposure: Past   Smokeless tobacco: Never  Vaping Use   Vaping status: Never Used  Substance and Sexual Activity   Alcohol use: No   Drug use: No   Sexual activity: Yes

## 2023-01-19 ENCOUNTER — Other Ambulatory Visit: Payer: Self-pay | Admitting: Orthopedic Surgery

## 2023-01-19 DIAGNOSIS — M4805 Spinal stenosis, thoracolumbar region: Secondary | ICD-10-CM

## 2023-01-19 DIAGNOSIS — M4326 Fusion of spine, lumbar region: Secondary | ICD-10-CM

## 2023-01-19 DIAGNOSIS — M963 Postlaminectomy kyphosis: Secondary | ICD-10-CM

## 2023-01-19 DIAGNOSIS — M4804 Spinal stenosis, thoracic region: Secondary | ICD-10-CM

## 2023-01-30 ENCOUNTER — Telehealth: Payer: Self-pay | Admitting: Surgical

## 2023-01-30 NOTE — Telephone Encounter (Signed)
Evert Kohl form [facility] called concerning MRI for above pt. Pt has been denied request for MRI at this time. If need be please contact [facility] at [phone] press option 4 and enter reference number [id number].

## 2023-02-01 NOTE — Telephone Encounter (Signed)
Pt has completed the MRI

## 2023-02-06 ENCOUNTER — Encounter: Payer: Self-pay | Admitting: Orthopedic Surgery

## 2023-02-14 ENCOUNTER — Other Ambulatory Visit: Payer: Commercial Managed Care - HMO

## 2023-05-18 DIAGNOSIS — I87009 Postthrombotic syndrome without complications of unspecified extremity: Secondary | ICD-10-CM | POA: Insufficient documentation

## 2023-07-01 NOTE — Progress Notes (Signed)
 " Cardiology Office Note:  .   Date:  07/02/2023  ID:  Joshua [name], DOB [birth year], MRN [id number] PCP: Gayl Males, MD  [facility] Providers Cardiologist:  Lamar Fitch, MD    History of Present Illness: .   Joshua [name] is a 53 y.o. male with a past medical history of hypertension, OSA, dyslipidemia, history of DVT, chronic pain syndrome, morbid obesity, DM2.  09/07/2021 Lexiscan  negative for ischemia 08/17/2021 coronary CTA calcium score of 0, but was a nondiagnostic study and obstructive CAD cannot be ruled out so Lexiscan  was arranged 08/04/2021 echo EF 60 to 65%, mild LVH, grade 1 DD  Most recently evaluated by Dr. Fitch on 12/01/2021.  Initially establish care for evaluation of atypical chest pain >> coronary CTA was arranged which was uninterpretable >> 2-day stress evaluation which was negative for exercise-induced ischemia.  During this visit, he was doing well, he was referred to pulmonology for follow-up of untreated OSA, and advised to follow-up in 1 year.  He presents today for follow up of his hypertension and shortness of breath. He has noticed over the last 6 or 7 months when he tries to lay down in the bed he feels short of breath and has to sleep the remainder of the night and recliner.  He has been evaluated for OSA, stated that it was severe, he has tried a mask--was unable to tolerate this, was advised to follow-up with a dentist and apparently was evaluated for a dental device however he is not entirely sure what happened, it seems as if he was lost to follow-up.  He has also been bothered by some pedal edema.  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  He is not had any further episodes of chest pain. He denies chest pain, palpitations, pnd, orthopnea, n, v, dizziness, syncope,  weight gain, or early satiety.   ROS: Review of Systems  Constitutional:  Positive for malaise/fatigue.  Respiratory:  Positive for shortness of breath.   Cardiovascular:   Positive for leg swelling.  All other systems reviewed and are negative.    Studies Reviewed: SABRA   EKG Interpretation Date/Time:  Monday July 02 2023 08:04:35 EDT Ventricular Rate:  97 PR Interval:  142 QRS Duration:  96 QT Interval:  344 QTC Calculation: 436 R Axis:   -16  Text Interpretation: Normal sinus rhythm Minimal voltage criteria for LVH, may be normal variant ( R in aVL ) When compared with ECG of 21-Mar-2010 09:31, No significant change was found Confirmed by Carlin Nest [phone]) on 07/02/2023 8:07:44 AM    Cardiac Studies & Procedures   ______________________________________________________________________________________________   STRESS TESTS  MYOCARDIAL PERFUSION IMAGING 09/07/2021  Narrative   The study is normal. The study is low risk.   Left ventricular function is normal. Nuclear stress EF: 55 %. The left ventricular ejection fraction is normal (55-65%). End diastolic cavity size is normal.   Prior study not available for comparison.   ECHOCARDIOGRAM  ECHOCARDIOGRAM COMPLETE 08/04/2021  Narrative ECHOCARDIOGRAM REPORT    Patient Name:   Joshua [name] Date of Exam: 08/04/2021 Medical Rec #:  [id number]      Height:       72.0 in Accession #:    [id number]     Weight:       301.2 lb Date of Birth:  [birth year]       BSA:          2.536 m Patient Age:    14  years       BP:           140/86 mmHg Patient Gender: M              HR:           122 bpm. Exam Location:  [facility]  Procedure: 2D Echo, Cardiac Doppler, Color Doppler and Strain Analysis  Indications:    Chest pain of uncertain etiology [R07.9 (ICD-10-CM)]; Chest pain, unspecified type [R07.9 (ICD-10-CM)]  History:        Patient has no prior history of Echocardiogram examinations. Signs/Symptoms:Dyspnea.  Sonographer:    Charlie Jointer RDCS Referring Phys: [id number] LAMAR JINNY FITCH   Sonographer Comments: Image acquisition challenging due to patient body habitus. IMPRESSIONS   1. Left  ventricular ejection fraction, by estimation, is 60 to 65%. The left ventricle has normal function. The left ventricle has no regional wall motion abnormalities. There is mild left ventricular hypertrophy. Left ventricular diastolic parameters are consistent with Grade I diastolic dysfunction (impaired relaxation). 2. Right ventricular systolic function is normal. The right ventricular size is normal. 3. The mitral valve is normal in structure. No evidence of mitral valve regurgitation. No evidence of mitral stenosis. 4. The aortic valve is normal in structure. Aortic valve regurgitation is not visualized. No aortic stenosis is present. 5. The inferior vena cava is normal in size with greater than 50% respiratory variability, suggesting right atrial pressure of 3 mmHg.  FINDINGS Left Ventricle: Left ventricular ejection fraction, by estimation, is 60 to 65%. The left ventricle has normal function. The left ventricle has no regional wall motion abnormalities. The left ventricular internal cavity size was normal in size. There is mild left ventricular hypertrophy. Left ventricular diastolic parameters are consistent with Grade I diastolic dysfunction (impaired relaxation).  Right Ventricle: The right ventricular size is normal. No increase in right ventricular wall thickness. Right ventricular systolic function is normal.  Left Atrium: Left atrial size was normal in size.  Right Atrium: Right atrial size was normal in size.  Pericardium: There is no evidence of pericardial effusion.  Mitral Valve: The mitral valve is normal in structure. No evidence of mitral valve regurgitation. No evidence of mitral valve stenosis.  Tricuspid Valve: The tricuspid valve is normal in structure. Tricuspid valve regurgitation is not demonstrated. No evidence of tricuspid stenosis.  Aortic Valve: The aortic valve is normal in structure. Aortic valve regurgitation is not visualized. No aortic stenosis is  present.  Pulmonic Valve: The pulmonic valve was normal in structure. Pulmonic valve regurgitation is not visualized. No evidence of pulmonic stenosis.  Aorta: The aortic root is normal in size and structure.  Venous: The inferior vena cava is normal in size with greater than 50% respiratory variability, suggesting right atrial pressure of 3 mmHg.  IAS/Shunts: No atrial level shunt detected by color flow Doppler.   LEFT VENTRICLE PLAX 2D LVIDd:         5.40 cm Diastology LVIDs:         3.60 cm LV e' medial:    9.90 cm/s LV PW:         1.20 cm LV E/e' medial:  6.3 LV IVS:        1.30 cm LV e' lateral:   10.70 cm/s LV E/e' lateral: 5.8   RIGHT VENTRICLE             IVC RV Basal diam:  3.20 cm     IVC diam: 2.00 cm RV S prime:  19.50 cm/s TAPSE (M-mode): 2.3 cm  LEFT ATRIUM             Index        RIGHT ATRIUM           Index LA diam:        3.80 cm 1.50 cm/m   RA Area:     12.10 cm LA Vol (A2C):   38.3 ml 15.11 ml/m  RA Volume:   21.60 ml  8.52 ml/m LA Vol (A4C):   49.2 ml 19.40 ml/m LA Biplane Vol: 47.5 ml 18.73 ml/m AORTIC VALVE LVOT Vmax:   135.00 cm/s LVOT Vmean:  94.600 cm/s LVOT VTI:    0.209 m  AORTA Ao Root diam: 3.50 cm Ao Asc diam:  3.30 cm  MITRAL VALVE MV Area (PHT): 3.10 cm    SHUNTS MV Decel Time: 245 msec    Systemic VTI: 0.21 m MV E velocity: 62.10 cm/s MV A velocity: 77.10 cm/s MV E/A ratio:  0.81  Lamar Fitch MD Electronically signed by Lamar Fitch MD Signature Date/Time: 08/04/2021/5:31:28 PM    Final      CT SCANS  CT CORONARY MORPH W/CTA COR W/SCORE 08/15/2021  Addendum 08/17/2021 10:50 AM ADDENDUM REPORT: 08/17/2021 10:48  CLINICAL DATA:  CP  EXAM: Cardiac/Coronary  CTA  TECHNIQUE: The patient was scanned on a [employer].  FINDINGS: A 130 kV prospective scan was triggered in the descending thoracic aorta at 111 HU's. Axial non-contrast 3 mm slices were carried out through the heart. The data  set was analyzed on a dedicated work station and scored using the Agatson method. Gantry rotation speed was 250 msecs and collimation was .6 mm. No beta blockade and 0.8 mg of sl NTG was given. The 3D data set was reconstructed in 5% intervals of the 67-82 % of the R-R cycle. Diastolic phases were analyzed on a dedicated work station using MPR, MIP and VRT modes. The patient received 80 cc of contrast.  Aorta:  Normal size.  No calcifications.  No dissection.  Aortic Valve:  Trileaflet.  Minimal calcifications.  Coronary Arteries:  Normal coronary origin.  Right dominance.  RCA is a large dominant artery that gives rise to PDA and PLA. There is significant motion artifact which affects significantly analysis of the very proximal portion of the RCA as well as mid portion of this artery. There is lack of continuity of the flow which could be related to motion artifact but I can not exclude significant obstruction in this area. In properly visualized portion of the RCA there is no plaque.  Left main is a large artery that gives rise to LAD and LCX arteries.  LAD is a large vessel that has no plaque but this artery also has significant motion artifact that prevent proper interpretation. It affects mid portion of this artery as well as D2.  LCX is a non-dominant artery that gives rise to one large OM1 and OM2 branches. There is no plaque. Similar motion artifact affects proximal and distal portion of this artery  Other findings:  Normal pulmonary vein drainage into the left atrium.  Normal left atrial appendage without a thrombus.  Normal size of the pulmonary artery.  IMPRESSION: 1. Coronary calcium score of 0. This was 0 percentile for age and sex matched control.  2. Normal coronary origin with right dominance.  3. CAD-RADS N Non-diagnostic study. Obstructive CAD can't be excluded. Alternative evaluation is recommended.   Electronically Signed By: Lamar Fitch  M.D. On: 08/17/2021 10:48  Narrative EXAM: OVER-READ INTERPRETATION  CT CHEST  The following report is an over-read performed by radiologist Dr. Rea Marc of [facility] on 08/15/2021. This over-read does not include interpretation of cardiac or coronary anatomy or pathology. The coronary calcium score/coronary CTA interpretation by the cardiologist is attached.  COMPARISON:  None.  FINDINGS: Vascular: Normal heart size. No pericardial effusion. Normal caliber thoracic aorta with no atherosclerotic disease. No suspicious filling defects of the central pulmonary arteries.  Mediastinum/Nodes: Esophagus is unremarkable. No pathologically enlarged lymph nodes seen in the chest.  Lungs/Pleura: Central airways are patent. Mild bibasilar atelectasis. No consolidation, pleural effusion or pneumothorax.  Upper Abdomen: No acute abnormality.  Musculoskeletal: No chest wall mass or suspicious bone lesions identified.  IMPRESSION: No acute extracardiac abnormality.  Electronically Signed: By: Rea Marc M.D. On: 08/15/2021 13:46     ______________________________________________________________________________________________      Risk Assessment/Calculations:     HYPERTENSION CONTROL Vitals:   07/02/23 0753 07/02/23 0846  BP: (!) 124/90 (!) 124/90    The patient's blood pressure is elevated above target today.  In order to address the patient's elevated BP: Blood pressure will be monitored at home to determine if medication changes need to be made.          Physical Exam:   VS:  BP (!) 124/90   Pulse 97   Ht 5' 11 (1.803 m)   Wt (!) 301 lb (136.5 kg)   SpO2 97%   BMI 41.98 kg/m    Wt Readings from Last 3 Encounters:  07/02/23 (!) 301 lb (136.5 kg)  09/26/22 (!) 308 lb (139.7 kg)  12/19/21 (!) 302 lb 3.2 oz (137.1 kg)    GEN: Well nourished, well developed in no acute distress NECK: No JVD; No carotid bruits CARDIAC: RRR, no murmurs,  rubs, gallops RESPIRATORY:  Clear to auscultation without rales, wheezing or rhonchi  ABDOMEN: Soft, non-tender, non-distended EXTREMITIES: Nonpitting edema; No deformity   ASSESSMENT AND PLAN: .   Hypertension-blood pressure today is controlled at 124/90, continue Norvasc 5 mg daily, continue Diovan HCT 320-12.5 mg daily.  Dyslipidemia-this appears to be formally monitored by his PCP, however he is not entirely sure who checks his cholesterol but we do not have any records available for review.  Will check fasting lipid panel and liver function testing.  DOE-will repeat echocardiogram, he also has had some pedal edema, will check proBNP  OSA-he has had difficulty complying with CPAP therapy, was unable to tolerate facemask, some discussion was had regarding inspire and also a dental device but it appears he may have just been lost to follow-up with a dentist.  Encouraged him to follow-up with dentist.  Pedal edema-he is wearing compression socks, they are edematous but is not pitting.     DM2 -newly diagnosed, followed by his PCP, currently on metformin 500 mg twice daily.  Obesity-BMI greater than 40, Heart healthy diet and regular cardiovascular exercise encouraged.      Dispo: Echocardiogram, labs per above, follow-up in 1 year.  Signed, Delon JAYSON Hoover, NP  "

## 2023-07-02 ENCOUNTER — Ambulatory Visit: Attending: Cardiology | Admitting: Cardiology

## 2023-07-02 ENCOUNTER — Encounter: Payer: Self-pay | Admitting: Cardiology

## 2023-07-02 VITALS — BP 124/90 | HR 97 | Ht 71.0 in | Wt 301.0 lb

## 2023-07-02 DIAGNOSIS — E782 Mixed hyperlipidemia: Secondary | ICD-10-CM

## 2023-07-02 DIAGNOSIS — R0609 Other forms of dyspnea: Secondary | ICD-10-CM

## 2023-07-02 DIAGNOSIS — I10 Essential (primary) hypertension: Secondary | ICD-10-CM | POA: Diagnosis not present

## 2023-07-02 DIAGNOSIS — E6609 Other obesity due to excess calories: Secondary | ICD-10-CM

## 2023-07-02 DIAGNOSIS — Z6841 Body Mass Index (BMI) 40.0 and over, adult: Secondary | ICD-10-CM

## 2023-07-02 DIAGNOSIS — G4733 Obstructive sleep apnea (adult) (pediatric): Secondary | ICD-10-CM

## 2023-07-02 DIAGNOSIS — R600 Localized edema: Secondary | ICD-10-CM

## 2023-07-02 NOTE — Patient Instructions (Signed)
 Medication Instructions:  Your physician recommends that you continue on your current medications as directed. Please refer to the Current Medication list given to you today.  *If you need a refill on your cardiac medications before your next appointment, please call your pharmacy*  Other Instructions Follow up with dentist   Lab Work: Your physician recommends that you return for lab work in: when lab open You need to have labs done when you are fasting.  You can come Monday through Friday 8:30 am to 12:00 pm and 1:15 to 4:30. You do not need to make an appointment as the order has already been placed. The labs you are going to have done are BMET, Lipids, ProBNP.    Testing/Procedures: Your physician has requested that you have an echocardiogram. Echocardiography is a painless test that uses sound waves to create images of your heart. It provides your doctor with information about the size and shape of your heart and how well your heart's chambers and valves are working. This procedure takes approximately one hour. There are no restrictions for this procedure. Please do NOT wear cologne, perfume, aftershave, or lotions (deodorant is allowed). Please arrive 15 minutes prior to your appointment time.  Please note: We ask at that you not bring children with you during ultrasound (echo/ vascular) testing. Due to room size and safety concerns, children are not allowed in the ultrasound rooms during exams. Our front office staff cannot provide observation of children in our lobby area while testing is being conducted. An adult accompanying a patient to their appointment will only be allowed in the ultrasound room at the discretion of the ultrasound technician under special circumstances. We apologize for any inconvenience.    Follow-Up: At [facility], you and your health needs are our priority.  As part of our continuing mission to provide you with exceptional heart care, we have created  designated Provider Care Teams.  These Care Teams include your primary Cardiologist (physician) and Advanced Practice Providers (APPs -  Physician Assistants and Nurse Practitioners) who all work together to provide you with the care you need, when you need it.  We recommend signing up for the patient portal called "MyChart".  Sign up information is provided on this After Visit Summary.  MyChart is used to connect with patients for Virtual Visits (Telemedicine).  Patients are able to view lab/test results, encounter notes, upcoming appointments, etc.  Non-urgent messages can be sent to your provider as well.   To learn more about what you can do with MyChart, go to [email or web address].    Your next appointment:   12 month(s)  The format for your next appointment:   In Person with Dr. Bing Matter  Provider:   Wallis Bamberg, NP

## 2023-07-24 ENCOUNTER — Other Ambulatory Visit

## 2023-07-26 ENCOUNTER — Ambulatory Visit

## 2023-08-15 ENCOUNTER — Ambulatory Visit

## 2023-09-05 ENCOUNTER — Ambulatory Visit: Admitting: Surgical

## 2024-02-08 ENCOUNTER — Other Ambulatory Visit: Payer: Self-pay | Admitting: Orthopedic Surgery

## 2024-02-08 DIAGNOSIS — M19011 Primary osteoarthritis, right shoulder: Secondary | ICD-10-CM

## 2024-02-12 ENCOUNTER — Other Ambulatory Visit: Payer: Self-pay | Admitting: Orthopedic Surgery

## 2024-02-13 ENCOUNTER — Ambulatory Visit
Admission: RE | Admit: 2024-02-13 | Discharge: 2024-02-13 | Disposition: A | Discharge status: Home / Self Care | Payer: Worker's Compensation | Source: Ambulatory Visit | Attending: Orthopedic Surgery | Admitting: Orthopedic Surgery

## 2024-02-13 ENCOUNTER — Telehealth: Payer: Self-pay

## 2024-02-13 ENCOUNTER — Telehealth: Payer: Self-pay | Admitting: *Deleted

## 2024-02-13 DIAGNOSIS — M19011 Primary osteoarthritis, right shoulder: Secondary | ICD-10-CM

## 2024-02-13 NOTE — Telephone Encounter (Signed)
   Pre-operative Risk Assessment    Patient Name: Joshua [name]  DOB: [birth year] MRN: [id number]      Request for Surgical Clearance    Procedure:  Rt Reverse Total Shoulder Arthroplasty  Date of Surgery:  Clearance 03/06/24                                 Surgeon:  Eva Herring Surgeon's Group or Practice Name:  [facility] Phone number:  [phone] Fax number:  [phone]   Type of Clearance Requested:   - Medical  - Pharmacy:  Hold Rivaroxaban  (Xarelto ) Instructions for blood thinners   Type of Anesthesia:  Not Indicated   Additional requests/questions:  Please fax a copy of this form, last office note, pertinent records to the surgeon's office.  Signed, Arloa Donovan Dines   02/13/2024, 1:16 PM

## 2024-02-13 NOTE — Telephone Encounter (Signed)
 Appointment scheduled for 02/26/2024 @ 9:40am. Med req and consent are complete. Call patient at [phone]

## 2024-02-13 NOTE — Telephone Encounter (Signed)
  Patient Consent for Virtual Visit         Joshua [name] has provided verbal consent on 02/13/2024 for a virtual visit (video or telephone). Appointment scheduled for 02/26/2024 @ 9:40am. Med req and consent are complete. Call patient at [phone]    CONSENT FOR VIRTUAL VISIT FOR:  Joshua [name]  By participating in this virtual visit I agree to the following:  I hereby voluntarily request, consent and authorize [facility] and its employed or contracted physicians, physician assistants, nurse practitioners or other licensed health care professionals (the Practitioner), to provide me with telemedicine health care services (the "Services) as deemed necessary by the treating Practitioner. I acknowledge and consent to receive the Services by the Practitioner via telemedicine. I understand that the telemedicine visit will involve communicating with the Practitioner through live audiovisual communication technology and the disclosure of certain medical information by electronic transmission. I acknowledge that I have been given the opportunity to request an in-person assessment or other available alternative prior to the telemedicine visit and am voluntarily participating in the telemedicine visit.  I understand that I have the right to withhold or withdraw my consent to the use of telemedicine in the course of my care at any time, without affecting my right to future care or treatment, and that the Practitioner or I may terminate the telemedicine visit at any time. I understand that I have the right to inspect all information obtained and/or recorded in the course of the telemedicine visit and may receive copies of available information for a reasonable fee.  I understand that some of the potential risks of receiving the Services via telemedicine include:  Delay or interruption in medical evaluation due to technological equipment failure or disruption; Information transmitted may not be  sufficient (e.g. poor resolution of images) to allow for appropriate medical decision making by the Practitioner; and/or  In rare instances, security protocols could fail, causing a breach of personal health information.  Furthermore, I acknowledge that it is my responsibility to provide information about my medical history, conditions and care that is complete and accurate to the best of my ability. I acknowledge that Practitioner's advice, recommendations, and/or decision may be based on factors not within their control, such as incomplete or inaccurate data provided by me or distortions of diagnostic images or specimens that may result from electronic transmissions. I understand that the practice of medicine is not an exact science and that Practitioner makes no warranties or guarantees regarding treatment outcomes. I acknowledge that a copy of this consent can be made available to me via my patient portal [facility] MyChart), or I can request a printed copy by calling the office of [facility].    I understand that my insurance will be billed for this visit.   I have read or had this consent read to me. I understand the contents of this consent, which adequately explains the benefits and risks of the Services being provided via telemedicine.  I have been provided ample opportunity to ask questions regarding this consent and the Services and have had my questions answered to my satisfaction. I give my informed consent for the services to be provided through the use of telemedicine in my medical care

## 2024-02-13 NOTE — Telephone Encounter (Signed)
   Name: Joshua [name]  DOB: [birth year]  MRN: [id number]  Primary Cardiologist: Lamar Fitch, MD   Preoperative team, please contact this patient and set up a phone call appointment for further preoperative risk assessment. Please obtain consent and complete medication review. Thank you for your help.  I confirm that guidance regarding antiplatelet and oral anticoagulation therapy has been completed and, if necessary, noted below.  Xarelto  is prescribed by Dr.Christopher [facility]. Please have surgeon reach out to that practice.   I also confirmed the patient resides in the state of [place] . As per [facility] Medical Board telemedicine laws, the patient must reside in the state in which the provider is licensed.   Lamarr Satterfield, NP 02/13/2024, 1:37 PM [facility]

## 2024-02-25 ENCOUNTER — Encounter: Payer: Self-pay | Admitting: Radiology

## 2024-02-25 NOTE — Patient Instructions (Signed)
 SURGICAL WAITING ROOM VISITATION  Patients having surgery or a procedure may have no more than 2 support people in the waiting area - these visitors may rotate.    Children under the age of 63 must have an adult with them who is not the patient.  Visitors with respiratory illnesses are discouraged from visiting and should remain at home.  If the patient needs to stay at the hospital during part of their recovery, the visitor guidelines for inpatient rooms apply. Pre-op nurse will coordinate an appropriate time for 1 support person to accompany patient in pre-op.  This support person may not rotate.    Please refer to the [facility] website for the visitor guidelines for Inpatients (after your surgery is over and you are in a regular room).       Your procedure is scheduled on:  03/06/2024    Report to [facility] Main Entrance    Report to admitting at  0730 AM   Call this number if you have problems the morning of surgery [phone]   Do not eat food :After Midnight.   After Midnight you may have the following liquids until __ 0700____ AM  DAY OF SURGERY  Water Non-Citrus Juices (without pulp, NO RED-Apple, White grape, White cranberry) Black Coffee (NO MILK/CREAM OR CREAMERS, sugar ok)  Clear Tea (NO MILK/CREAM OR CREAMERS, sugar ok) regular and decaf                             Plain Jell-O (NO RED)                                           Fruit ices (not with fruit pulp, NO RED)                                     Popsicles (NO RED)                                                               Sports drinks like Gatorade (NO RED)                   The day of surgery:  Drink ONE (1) Pre-Surgery Clear Ensure or G2 at   0730AM the morning of surgery. Drink in one sitting. Do not sip.  This drink was given to you during your hospital  pre-op appointment visit. Nothing else to drink after completing the  Pre-Surgery Clear Ensure or G2.          If you have  questions, please contact your surgeon's office.       Oral Hygiene is also important to reduce your risk of infection.                                    Remember - BRUSH YOUR TEETH THE MORNING OF SURGERY WITH YOUR REGULAR TOOTHPASTE  DENTURES WILL BE REMOVED PRIOR TO SURGERY PLEASE DO NOT APPLY Poly grip OR  ADHESIVES!!!   Do NOT smoke after Midnight   Stop all vitamins and herbal supplements 7 days before surgery.   Take these medicines the morning of surgery with A SIP OF WATER:  amlodipine   DO NOT TAKE ANY ORAL DIABETIC MEDICATIONS DAY OF YOUR SURGERY  Bring CPAP mask and tubing day of surgery.                              You may not have any metal on your body including hair pins, jewelry, and body piercing             Do not wear make-up, lotions, powders, perfumes/cologne, or deodorant  Do not wear nail polish including gel and S&S, artificial/acrylic nails, or any other type of covering on natural nails including finger and toenails. If you have artificial nails, gel coating, etc. that needs to be removed by a nail salon please have this removed prior to surgery or surgery may need to be canceled/ delayed if the surgeon/ anesthesia feels like they are unable to be safely monitored.   Do not shave  48 hours prior to surgery.               Men may shave face and neck.   Do not bring valuables to the hospital. [facility] IS NOT             RESPONSIBLE   FOR VALUABLES.   Contacts, glasses, dentures or bridgework may not be worn into surgery.   Bring small overnight bag day of surgery.   DO NOT BRING YOUR HOME MEDICATIONS TO THE HOSPITAL. PHARMACY WILL DISPENSE MEDICATIONS LISTED ON YOUR MEDICATION LIST TO YOU DURING YOUR ADMISSION IN THE HOSPITAL!    Patients discharged on the day of surgery will not be allowed to drive home.  Someone NEEDS to stay with you for the first 24 hours after anesthesia.   Special Instructions: Bring a copy of your healthcare power of  attorney and living will documents the day of surgery if you haven't scanned them before.              Please read over the following fact sheets you were given: IF YOU HAVE QUESTIONS ABOUT YOUR PRE-OP INSTRUCTIONS PLEASE CALL [phone].   If you received a COVID test during your pre-op visit  it is requested that you wear a mask when out in public, stay away from anyone that may not be feeling well and notify your surgeon if you develop symptoms. If you test positive for Covid or have been in contact with anyone that has tested positive in the last 10 days please notify you surgeon.      Pre-operative 4 CHG Bath Instructions   You can play a key role in reducing the risk of infection after surgery. Your skin needs to be as free of germs as possible. You can reduce the number of germs on your skin by washing with CHG (chlorhexidine  gluconate) soap before surgery. CHG is an antiseptic soap that kills germs and continues to kill germs even after washing.   DO NOT use if you have an allergy to chlorhexidine /CHG or antibacterial soaps. If your skin becomes reddened or irritated, stop using the CHG and notify one of our RNs at [phone].   Please shower with the CHG soap starting 4 days before surgery using the following schedule:     Please keep in mind the following:  DO NOT shave, including legs and underarms, starting the day of your first shower.   You may shave your face at any point before/day of surgery.  Place clean sheets on your bed the day you start using CHG soap. Use a clean washcloth (not used since being washed) for each shower. DO NOT sleep with pets once you start using the CHG.   CHG Shower Instructions:  If you choose to wash your hair and private area, wash first with your normal shampoo/soap.  After you use shampoo/soap, rinse your hair and body thoroughly to remove shampoo/soap residue.  Turn the water OFF and apply about 3 tablespoons (45 ml) of CHG soap to a CLEAN  washcloth.  Apply CHG soap ONLY FROM YOUR NECK DOWN TO YOUR TOES (washing for 3-5 minutes)  DO NOT use CHG soap on face, private areas, open wounds, or sores.  Pay special attention to the area where your surgery is being performed.  If you are having back surgery, having someone wash your back for you may be helpful. Wait 2 minutes after CHG soap is applied, then you may rinse off the CHG soap.  Pat dry with a clean towel  Put on clean clothes/pajamas   If you choose to wear lotion, please use ONLY the CHG-compatible lotions on the back of this paper.     Additional instructions for the day of surgery: DO NOT APPLY any lotions, deodorants, cologne, or perfumes.   Put on clean/comfortable clothes.  Brush your teeth.  Ask your nurse before applying any prescription medications to the skin.      CHG Compatible Lotions   Aveeno Moisturizing lotion  Cetaphil Moisturizing Cream  Cetaphil Moisturizing Lotion  Clairol Herbal Essence Moisturizing Lotion, Dry Skin  Clairol Herbal Essence Moisturizing Lotion, Extra Dry Skin  Clairol Herbal Essence Moisturizing Lotion, Normal Skin  Curel Age Defying Therapeutic Moisturizing Lotion with Alpha Hydroxy  Curel Extreme Care Body Lotion  Curel Soothing Hands Moisturizing Hand Lotion  Curel Therapeutic Moisturizing Cream, Fragrance-Free  Curel Therapeutic Moisturizing Lotion, Fragrance-Free  Curel Therapeutic Moisturizing Lotion, Original Formula  Eucerin Daily Replenishing Lotion  Eucerin Dry Skin Therapy Plus Alpha Hydroxy Crme  Eucerin Dry Skin Therapy Plus Alpha Hydroxy Lotion  Eucerin Original Crme  Eucerin Original Lotion  Eucerin Plus Crme Eucerin Plus Lotion  Eucerin TriLipid Replenishing Lotion  Keri Anti-Bacterial Hand Lotion  Keri Deep Conditioning Original Lotion Dry Skin Formula Softly Scented  Keri Deep Conditioning Original Lotion, Fragrance Free Sensitive Skin Formula  Keri Lotion Fast Absorbing Fragrance Free Sensitive  Skin Formula  Keri Lotion Fast Absorbing Softly Scented Dry Skin Formula  Keri Original Lotion  Keri Skin Renewal Lotion Keri Silky Smooth Lotion  Keri Silky Smooth Sensitive Skin Lotion  Nivea Body Creamy Conditioning Oil  Nivea Body Extra Enriched Lotion  Nivea Body Original Lotion  Nivea Body Sheer Moisturizing Lotion Nivea Crme  Nivea Skin Firming Lotion  NutraDerm 30 Skin Lotion  NutraDerm Skin Lotion  NutraDerm Therapeutic Skin Cream  NutraDerm Therapeutic Skin Lotion  ProShield Protective Hand Cream  Provon moisturizing lotion  [place]- Preparing for Total Shoulder Arthroplasty    Before surgery, you can play an important role. Because skin is not sterile, your skin needs to be as free of germs as possible. You can reduce the number of germs on your skin by using the following products. Benzoyl Peroxide Gel Reduces the number of germs present on the skin Applied twice a day to shoulder area starting two days  before surgery    ==================================================================  Please follow these instructions carefully:  BENZOYL PEROXIDE 5% GEL  Please do not use if you have an allergy to benzoyl peroxide.   If your skin becomes reddened/irritated stop using the benzoyl peroxide.  Starting two days before surgery, apply as follows: Apply benzoyl peroxide in the morning and at night. Apply after taking a shower. If you are not taking a shower clean entire shoulder front, back, and side along with the armpit with a clean wet washcloth.  Place a quarter-sized dollop on your shoulder and rub in thoroughly, making sure to cover the front, back, and side of your shoulder, along with the armpit.   2 days before ____ AM   ____ PM              1 day before ____ AM   ____ PM                         Do this twice a day for two days.  (Last application is the night before surgery, AFTER using the CHG soap as described below).  Do NOT apply benzoyl peroxide gel  on the day of surgery.

## 2024-02-25 NOTE — Progress Notes (Addendum)
 Anesthesia Review:  PCP: holley Speaks  Cardiologist : Lamar Fitch  jennifer Woody,NP LVO 07/02/23  Telephone vi sit for clearance on 02/26/2024 with Delon Carlin PIETY  Hem/Onc- 05/18/23- Harish   PPM/ ICD: Device Orders: Rep Notified:  Chest x-ray : 02/27/24 - routed to DR Dozier on 03/03/24.  EKG :  07/02/2023  Echo : 2023  Stress test: 2023  CT Cors- 2023  Cardiac Cath :   Activity level: can do a flight of stairs without difficutly  Sleep Study/ CPAP : has sleep apnea does not use cpap  Fasting Blood Sugar :      / Checks Blood Sugar -- times a day:     DM- type ?2- pt unsure pt checks glucose infrequently at home not on meds  Hgba1c- 02/27/2024- 7.9 -routed to DR Dozier on 02/27/24 [facility] aware.   Blood Thinner/ Instructions /Last Dose: ASA / Instructions/ Last Dose :    Xarelto - last dose on 03/02/2024

## 2024-02-26 ENCOUNTER — Ambulatory Visit: Payer: Self-pay | Attending: Cardiology | Admitting: Cardiology

## 2024-02-26 DIAGNOSIS — Z01818 Encounter for other preprocedural examination: Secondary | ICD-10-CM

## 2024-02-26 NOTE — Progress Notes (Signed)
 Virtual Visit via Telephone Note   Because of Joshua [name] co-morbid illnesses, he is at least at moderate risk for complications without adequate follow up.  This format is felt to be most appropriate for this patient at this time.  Due to technical limitations with video connection web designer), today's appointment will be conducted as an audio only telehealth visit, and Joshua [name] verbally agreed to proceed in this manner.   All issues noted in this document were discussed and addressed.  No physical exam could be performed with this format.  Evaluation Performed:  Preoperative cardiovascular risk assessment _____________   Date:  02/26/2024   Patient ID:  Joshua [name], DOB Feb 09, 1971, MRN [id number] Patient Location:  Home Provider location:   Office  Primary Care Provider:  Gayl Males, MD Primary Cardiologist:  Lamar Fitch, MD  Chief Complaint / Patient Profile   53 y.o. y/o male with a h/o hypertension, OSA, dyslipidemia, history of DVT on Xarelto  managed by hematology, chronic pain syndrome, DM2, who is pending right reverse total shoulder arthroplasty and presents today for telephonic preoperative cardiovascular risk assessment.  History of Present Illness    Joshua [name] is a 53 y.o. male who presents via audio/video conferencing for a telehealth visit today.  Pt was last seen in cardiology clinic on 07/02/2023 by myself.  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  The patient is now pending procedure as outlined above. Since his last visit, he has been doing well from a cardiac perspective. He is not able to work, but stays busy around the house with inside and outside chores. He denies chest pain, palpitations, dyspnea, pnd, orthopnea, n, v, dizziness, syncope, edema, weight gain, or early satiety.     Past Medical  History    Past Medical History:  Diagnosis Date   Arthritis    wrists, knees, back, shoulders; everywhere (11/01/2016)   Chronic lower back pain    Daily headache    recently; related to pain in my left knee (11/01/2016)   Diabetes mellitus type 2, uncomplicated (HCC) 2024   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)    History of kidney stones    last flare up 2017   HTN (hypertension)    Hypertension    Migraine    might get 1/month now (11/01/2016)   OA (osteoarthritis) of knee    unilateral primary OA of left knee   Obesity    OSA (obstructive sleep apnea)    don't wear the mask (11/01/2016)   Personal history of thrombophlebitis    back in 2016   Past Surgical History:  Procedure Laterality Date   BACK SURGERY  1987/2005/2009; 03/25/2010; 2012 X 2; ?other dates   I've had 10 total   CARPAL TUNNEL RELEASE Bilateral    FOREARM SURGERY Right 2010   blood clots from my wrist to elbow (11/01/2016)   HAND SURGERY Right 2010   aneurysm in my palm   INCISION AND DRAINAGE OF WOUND     lumbar   JOINT REPLACEMENT     LITHOTRIPSY  X 2   LUMBAR DISC SURGERY  ~ 6   LUMBAR SPINE HARDWARE REMOVAL  X 1   POSTERIOR LUMBAR FUSION  X 2   SHOULDER ARTHROSCOPY W/ ROTATOR CUFF REPAIR Right 2010   TOTAL KNEE ARTHROPLASTY Left  10/31/2016   Procedure: LEFT TOTAL KNEE ARTHROPLASTY;  Surgeon: Vernetta Lonni GRADE, MD;  Location: [facility] OR;  Service: Orthopedics;  Laterality: Left;   TOTAL KNEE ARTHROPLASTY Right 07/03/2017   Procedure: RIGHT TOTAL KNEE ARTHROPLASTY;  Surgeon: Vernetta Lonni GRADE, MD;  Location: [facility] OR;  Service: Orthopedics;  Laterality: Right;    Allergies  No Known Allergies  Home Medications    Prior to Admission medications   Medication Sig Start Date End Date Taking? Authorizing Provider  acetaminophen  (TYLENOL ) 500 MG tablet Take 2,000 mg by mouth every 6 (six) hours as needed for mild pain (pain score 1-3) or moderate pain (pain score 4-6).     [provider]  amLODipine (NORVASC) 5 MG tablet Take 5 mg by mouth daily.    [provider]  atorvastatin (LIPITOR) 10 MG tablet Take 10 mg by mouth daily.    [provider]  DUPIXENT 300 MG/2ML SOAJ Inject 300 mg into the skin every 14 (fourteen) days. 07/13/23   [provider]  oxyCODONE  (OXY IR/ROXICODONE ) 5 MG immediate release tablet Take 5 mg by mouth every 6 (six) hours as needed for moderate pain (pain score 4-6) or severe pain (pain score 7-10).    [provider]  pregabalin (LYRICA) 200 MG capsule Take 200 mg by mouth daily as needed (nerve pain in back and shoulders). 01/28/24   [provider]  rivaroxaban  (XARELTO ) 10 MG TABS tablet Take 1 tablet (10 mg total) by mouth daily. Patient taking differently: Take 20 mg by mouth daily. 07/05/17   Vernetta Lonni GRADE, MD  valsartan-hydrochlorothiazide  (DIOVAN-HCT) 320-12.5 MG tablet Take 1 tablet by mouth daily.    [provider]    Physical Exam    Vital Signs:  Joshua [name] does not have vital signs available for review today.  Given telephonic nature of communication, physical exam is limited. AAOx3. NAD. Normal affect.  Speech and respirations are unlabored.  Accessory Clinical Findings    None  Assessment & Plan    1.  Preoperative Cardiovascular Risk Assessment: According to the Revised Cardiac Risk Index (RCRI), his Perioperative Risk of Major Cardiac Event is (%): 0.4 His Functional Capacity in METs is: 5.62 according to the [facility] Activity Status Index (DASI). Therefore, based on ACC/AHA guidelines, patient would be at acceptable risk for the planned procedure without further cardiovascular testing. I will route this recommendation to the requesting party via Epic fax function.   The patient was advised that if he develops new symptoms prior to surgery to contact our office to arrange for a follow-up visit, and he verbalized understanding.  Regarding  his Xarelto , our office does not manage this, he currently follows with [facility] and Vallathucherry C. Leverette, MD manages this. Holding this medication request will need to be provided by his office.   A copy of this note will be routed to requesting surgeon.  Time:   Today, I have spent 10 minutes with the patient with telehealth technology discussing medical history, symptoms, and management plan.     Joshua JAYSON Hoover, NP  02/26/2024, 7:55 AM

## 2024-02-27 ENCOUNTER — Encounter (HOSPITAL_COMMUNITY)
Admission: RE | Admit: 2024-02-27 | Discharge: 2024-02-27 | Disposition: A | Discharge status: Home / Self Care | Payer: Worker's Compensation | Source: Ambulatory Visit | Attending: Orthopedic Surgery | Admitting: Orthopedic Surgery

## 2024-02-27 ENCOUNTER — Encounter (HOSPITAL_COMMUNITY): Payer: Self-pay

## 2024-02-27 ENCOUNTER — Ambulatory Visit (HOSPITAL_COMMUNITY)
Admission: RE | Admit: 2024-02-27 | Discharge: 2024-02-27 | Disposition: A | Discharge status: Home / Self Care | Payer: Self-pay | Source: Ambulatory Visit | Attending: Orthopedic Surgery | Admitting: Orthopedic Surgery

## 2024-02-27 ENCOUNTER — Other Ambulatory Visit: Payer: Self-pay

## 2024-02-27 VITALS — BP 149/94 | HR 102 | Temp 98.4°F | Resp 16 | Ht 72.0 in | Wt 308.0 lb

## 2024-02-27 DIAGNOSIS — G4733 Obstructive sleep apnea (adult) (pediatric): Secondary | ICD-10-CM | POA: Diagnosis not present

## 2024-02-27 DIAGNOSIS — M19011 Primary osteoarthritis, right shoulder: Secondary | ICD-10-CM | POA: Diagnosis not present

## 2024-02-27 DIAGNOSIS — Z01818 Encounter for other preprocedural examination: Secondary | ICD-10-CM

## 2024-02-27 DIAGNOSIS — E119 Type 2 diabetes mellitus without complications: Secondary | ICD-10-CM | POA: Insufficient documentation

## 2024-02-27 DIAGNOSIS — Z86718 Personal history of other venous thrombosis and embolism: Secondary | ICD-10-CM | POA: Insufficient documentation

## 2024-02-27 DIAGNOSIS — Z01812 Encounter for preprocedural laboratory examination: Secondary | ICD-10-CM | POA: Diagnosis present

## 2024-02-27 DIAGNOSIS — J9811 Atelectasis: Secondary | ICD-10-CM | POA: Diagnosis not present

## 2024-02-27 DIAGNOSIS — I10 Essential (primary) hypertension: Secondary | ICD-10-CM | POA: Diagnosis not present

## 2024-02-27 HISTORY — DX: Personal history of other venous thrombosis and embolism: Z86.718

## 2024-02-27 LAB — CBC
HCT: 47.2 % (ref 39.0–52.0)
Hemoglobin: 14.6 g/dL (ref 13.0–17.0)
MCH: 26.9 pg (ref 26.0–34.0)
MCHC: 30.9 g/dL (ref 30.0–36.0)
MCV: 87.1 fL (ref 80.0–100.0)
Platelets: 203 K/uL (ref 150–400)
RBC: 5.42 MIL/uL (ref 4.22–5.81)
RDW: 13.2 % (ref 11.5–15.5)
WBC: 7.7 K/uL (ref 4.0–10.5)
nRBC: 0 % (ref 0.0–0.2)

## 2024-02-27 LAB — SURGICAL PCR SCREEN
MRSA, PCR: NEGATIVE
Staphylococcus aureus: NEGATIVE

## 2024-02-27 LAB — BASIC METABOLIC PANEL WITH GFR
Anion gap: 11 (ref 5–15)
BUN: 14 mg/dL (ref 6–20)
CO2: 24 mmol/L (ref 22–32)
Calcium: 9.6 mg/dL (ref 8.9–10.3)
Chloride: 101 mmol/L (ref 98–111)
Creatinine, Ser: 0.86 mg/dL (ref 0.61–1.24)
GFR, Estimated: >60 mL/min (ref >60)
Glucose, Bld: 181 mg/dL — ABNORMAL HIGH (ref 70–99)
Potassium: 4.1 mmol/L (ref 3.5–5.1)
Sodium: 136 mmol/L (ref 135–145)

## 2024-02-27 LAB — GLUCOSE, CAPILLARY: Glucose-Capillary: 176 mg/dL — ABNORMAL HIGH (ref 70–99)

## 2024-02-27 LAB — HEMOGLOBIN A1C
Hgb A1c MFr Bld: 7.9 % — ABNORMAL HIGH (ref 4.8–5.6)
Mean Plasma Glucose: 180.03 mg/dL

## 2024-02-28 NOTE — Progress Notes (Addendum)
 Anesthesia Chart Review   Case: [id number] Date/Time: 03/06/24 0947   Procedure: ARTHROPLASTY, SHOULDER, TOTAL, REVERSE (Right: Shoulder)   Anesthesia type: Choice   Diagnosis: Arthritis of right shoulder region [M19.011]   Pre-op diagnosis: RIGHT SHOULDER PRIMARY OSTEOARTHRITIS   Location: [facility] ROOM 07 / [facility]   Surgeons: Dozier Soulier, MD       DISCUSSION:53 y.o. never smoker with h/o HTN, OSA, CVT, DM II, right shoulder OA scheduled for above procedure 03/06/2024 with Dr. Soulier Dozier.   Per cardiology preoperative evaluation 02/26/2024, According to the Revised Cardiac Risk Index (RCRI), his Perioperative Risk of Major Cardiac Event is (%): 0.4 His Functional Capacity in METs is: 5.62 according to the [facility] Activity Status Index (DASI). Therefore, based on ACC/AHA guidelines, patient would be at acceptable risk for the planned procedure without further cardiovascular testing. I will route this recommendation to the requesting party via Epic fax function.    The patient was advised that if he develops new symptoms prior to surgery to contact our office to arrange for a follow-up visit, and he verbalized understanding.   Regarding his Xarelto , our office does not manage this, he currently follows with [facility] and Vallathucherry C. Leverette, MD manages this. Holding this medication request will need to be provided by his office.  Pt reports last dose of Xarelto  03/02/2024.   A1C 7.9, discussed with Dr. Thelda surgical scheduler.  VS: BP (!) 149/94   Pulse (!) 102   Temp 36.9 C (Oral)   Resp 16   Ht 6' (1.829 m)   Wt (!) 139.7 kg   SpO2 99%   BMI 41.77 kg/m   PROVIDERS: Gayl Males, MD is PCP   Primary Cardiologist:  Lamar Fitch, MD  LABS: Labs reviewed: Acceptable for surgery. and A1C forwarded to Surgeon and PCP (all labs ordered are listed, but only abnormal results are displayed)  Labs Reviewed  HEMOGLOBIN A1C - Abnormal; Notable for the  following components:      Result Value   Hgb A1c MFr Bld 7.9 (*)    All other components within normal limits  BASIC METABOLIC PANEL WITH GFR - Abnormal; Notable for the following components:   Glucose, Bld 181 (*)    All other components within normal limits  GLUCOSE, CAPILLARY - Abnormal; Notable for the following components:   Glucose-Capillary 176 (*)    All other components within normal limits  SURGICAL PCR SCREEN  CBC     IMAGES:   EKG:   CV: Myocardial Perfusion 09/07/2021   The study is normal. The study is low risk.   Left ventricular function is normal. Nuclear stress EF: 55 %. The left ventricular ejection fraction is normal (55-65%). End diastolic cavity size is normal.   Prior study not available for comparison.  Echo 08/04/2021 1. Left ventricular ejection fraction, by estimation, is 60 to 65%. The  left ventricle has normal function. The left ventricle has no regional  wall motion abnormalities. There is mild left ventricular hypertrophy.  Left ventricular diastolic parameters  are consistent with Grade I diastolic dysfunction (impaired relaxation).   2. Right ventricular systolic function is normal. The right ventricular  size is normal.   3. The mitral valve is normal in structure. No evidence of mitral valve  regurgitation. No evidence of mitral stenosis.   4. The aortic valve is normal in structure. Aortic valve regurgitation is  not visualized. No aortic stenosis is present.   5. The inferior vena cava is normal in  size with greater than 50%  respiratory variability, suggesting right atrial pressure of 3 mmHg.  Past Medical History:  Diagnosis Date   Arthritis    wrists, knees, back, shoulders; everywhere (11/01/2016)   Chronic lower back pain    Daily headache    recently; related to pain in my left knee (11/01/2016)   Diabetes mellitus type 2, uncomplicated (HCC) 2024   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)    History of blood  clots    History of kidney stones    last flare up 2017   HTN (hypertension)    Hypertension    Migraine    might get 1/month now (11/01/2016)   OA (osteoarthritis) of knee    unilateral primary OA of left knee   Obesity    OSA (obstructive sleep apnea)    don't wear the mask (11/01/2016)   Personal history of thrombophlebitis    back in 2016    Past Surgical History:  Procedure Laterality Date   BACK SURGERY  1987/2005/2009; 03/25/2010; 2012 X 2; ?other dates   I've had 10 total   CARPAL TUNNEL RELEASE Bilateral    FOREARM SURGERY Right 2010   blood clots from my wrist to elbow (11/01/2016)   HAND SURGERY Right 2010   aneurysm in my palm   INCISION AND DRAINAGE OF WOUND     lumbar   JOINT REPLACEMENT     LITHOTRIPSY  X 2   LUMBAR DISC SURGERY  ~ 6   LUMBAR SPINE HARDWARE REMOVAL  X 1   POSTERIOR LUMBAR FUSION  X 2   SHOULDER ARTHROSCOPY W/ ROTATOR CUFF REPAIR Right 2010   TOTAL KNEE ARTHROPLASTY Left 10/31/2016   Procedure: LEFT TOTAL KNEE ARTHROPLASTY;  Surgeon: Vernetta Lonni GRADE, MD;  Location: [facility];  Service: Orthopedics;  Laterality: Left;   TOTAL KNEE ARTHROPLASTY Right 07/03/2017   Procedure: RIGHT TOTAL KNEE ARTHROPLASTY;  Surgeon: Vernetta Lonni GRADE, MD;  Location: [facility];  Service: Orthopedics;  Laterality: Right;    MEDICATIONS:  acetaminophen  (TYLENOL ) 500 MG tablet   amLODipine (NORVASC) 5 MG tablet   atorvastatin (LIPITOR) 10 MG tablet   DUPIXENT 300 MG/2ML SOAJ   oxyCODONE  (OXY IR/ROXICODONE ) 5 MG immediate release tablet   pregabalin (LYRICA) 200 MG capsule   rivaroxaban  (XARELTO ) 10 MG TABS tablet   valsartan-hydrochlorothiazide  (DIOVAN-HCT) 320-12.5 MG tablet   No current facility-administered medications for this encounter.     Harlene Hoots Ward, PA-C [facility] [phone]

## 2024-02-28 NOTE — Anesthesia Preprocedure Evaluation (Addendum)
 Anesthesia Evaluation  Patient identified by MRN, date of birth, ID band Patient awake    Reviewed: Allergy & Precautions, NPO status , Patient's Chart, lab work & pertinent test results  History of Anesthesia Complications Negative for: history of anesthetic complications  Airway Mallampati: III  TM Distance: >3 FB Neck ROM: Full    Dental  (+) Dental Advisory Given, Missing   Pulmonary neg shortness of breath, sleep apnea (does not use CPAP) , neg COPD, neg recent URI   Pulmonary exam normal breath sounds clear to auscultation       Cardiovascular hypertension (amlodipine, valsartan-HCTZ), Pt. on medications (-) angina + DVT  (-) CAD (CAC 0 08/17/2021), (-) Past MI, (-) Cardiac Stents and (-) CABG (-) dysrhythmias  Rhythm:Regular Rate:Normal  HLD  Negative stress test 09/06/2021  TTE 08/04/2021: IMPRESSIONS    1. Left ventricular ejection fraction, by estimation, is 60 to 65%. The  left ventricle has normal function. The left ventricle has no regional  wall motion abnormalities. There is mild left ventricular hypertrophy.  Left ventricular diastolic parameters  are consistent with Grade I diastolic dysfunction (impaired relaxation).   2. Right ventricular systolic function is normal. The right ventricular  size is normal.   3. The mitral valve is normal in structure. No evidence of mitral valve  regurgitation. No evidence of mitral stenosis.   4. The aortic valve is normal in structure. Aortic valve regurgitation is  not visualized. No aortic stenosis is present.   5. The inferior vena cava is normal in size with greater than 50%  respiratory variability, suggesting right atrial pressure of 3 mmHg.     Neuro/Psych  Headaches, neg Seizures PSYCHIATRIC DISORDERS Anxiety      Neuromuscular disease (lumbar back pain)    GI/Hepatic negative GI ROS, Neg liver ROS,,,  Endo/Other  diabetes (Hgb A1c 7.9), Poorly Controlled,  Type 2  Class 3 obesity  Renal/GU negative Renal ROS     Musculoskeletal  (+) Arthritis , Osteoarthritis,    Abdominal  (+) + obese  Peds  Hematology negative hematology ROS (+) Lab Results      Component                Value               Date                      WBC                      7.7                 02/27/2024                HGB                      14.6                02/27/2024                HCT                      47.2                02/27/2024                MCV  87.1                02/27/2024                PLT                      203                 02/27/2024              Anesthesia Other Findings Last Xarelto : 03/02/2024  Reproductive/Obstetrics                              Anesthesia Physical Anesthesia Plan  ASA: 3  Anesthesia Plan: General   Post-op Pain Management: Regional block* and Tylenol  PO (pre-op)*   Induction: Intravenous  PONV Risk Score and Plan: 2 and Ondansetron , Dexamethasone , Midazolam  and Treatment may vary due to age or medical condition  Airway Management Planned: Oral ETT and Video Laryngoscope Planned  Additional Equipment:   Intra-op Plan:   Post-operative Plan: Extubation in OR  Informed Consent: I have reviewed the patients History and Physical, chart, labs and discussed the procedure including the risks, benefits and alternatives for the proposed anesthesia with the patient or authorized representative who has indicated his/her understanding and acceptance.     Dental advisory given  Plan Discussed with: CRNA and Anesthesiologist  Anesthesia Plan Comments: (See PAT note 02/27/2024  Discussed potential risks of nerve blocks including, but not limited to, infection, bleeding, nerve damage, seizures, pneumothorax, respiratory depression, and potential failure of the block. Alternatives to nerve blocks discussed. All questions answered.  Risks of general anesthesia  discussed including, but not limited to, sore throat, hoarse voice, chipped/damaged teeth, injury to vocal cords, nausea and vomiting, allergic reactions, lung infection, heart attack, stroke, and death. All questions answered. )         Anesthesia Quick Evaluation

## 2024-03-06 ENCOUNTER — Ambulatory Visit (HOSPITAL_COMMUNITY): Payer: Worker's Compensation | Admitting: Anesthesiology

## 2024-03-06 ENCOUNTER — Encounter (HOSPITAL_COMMUNITY): Payer: Self-pay | Admitting: Orthopedic Surgery

## 2024-03-06 ENCOUNTER — Ambulatory Visit (HOSPITAL_COMMUNITY)
Admission: RE | Admit: 2024-03-06 | Discharge: 2024-03-06 | Disposition: A | Discharge status: Home / Self Care | Payer: Worker's Compensation | Attending: Orthopedic Surgery | Admitting: Orthopedic Surgery

## 2024-03-06 ENCOUNTER — Ambulatory Visit (HOSPITAL_COMMUNITY): Payer: Worker's Compensation | Admitting: Physician Assistant

## 2024-03-06 ENCOUNTER — Other Ambulatory Visit: Payer: Self-pay

## 2024-03-06 ENCOUNTER — Encounter (HOSPITAL_COMMUNITY): Admission: RE | Disposition: A | Payer: Self-pay | Source: Home / Self Care | Attending: Orthopedic Surgery

## 2024-03-06 DIAGNOSIS — E66813 Obesity, class 3: Secondary | ICD-10-CM | POA: Diagnosis not present

## 2024-03-06 DIAGNOSIS — M19011 Primary osteoarthritis, right shoulder: Secondary | ICD-10-CM | POA: Insufficient documentation

## 2024-03-06 DIAGNOSIS — Z7901 Long term (current) use of anticoagulants: Secondary | ICD-10-CM | POA: Diagnosis not present

## 2024-03-06 DIAGNOSIS — Z7722 Contact with and (suspected) exposure to environmental tobacco smoke (acute) (chronic): Secondary | ICD-10-CM | POA: Diagnosis not present

## 2024-03-06 DIAGNOSIS — Z86718 Personal history of other venous thrombosis and embolism: Secondary | ICD-10-CM | POA: Insufficient documentation

## 2024-03-06 DIAGNOSIS — Z6841 Body Mass Index (BMI) 40.0 and over, adult: Secondary | ICD-10-CM | POA: Diagnosis not present

## 2024-03-06 DIAGNOSIS — I10 Essential (primary) hypertension: Secondary | ICD-10-CM | POA: Insufficient documentation

## 2024-03-06 DIAGNOSIS — E785 Hyperlipidemia, unspecified: Secondary | ICD-10-CM | POA: Diagnosis not present

## 2024-03-06 DIAGNOSIS — Z01818 Encounter for other preprocedural examination: Secondary | ICD-10-CM

## 2024-03-06 DIAGNOSIS — E1165 Type 2 diabetes mellitus with hyperglycemia: Secondary | ICD-10-CM | POA: Diagnosis not present

## 2024-03-06 DIAGNOSIS — G4733 Obstructive sleep apnea (adult) (pediatric): Secondary | ICD-10-CM | POA: Diagnosis not present

## 2024-03-06 DIAGNOSIS — Z79899 Other long term (current) drug therapy: Secondary | ICD-10-CM | POA: Insufficient documentation

## 2024-03-06 HISTORY — PX: REVERSE SHOULDER ARTHROPLASTY: SHX5054

## 2024-03-06 LAB — GLUCOSE, CAPILLARY
Glucose-Capillary: 197 mg/dL — ABNORMAL HIGH (ref 70–99)
Glucose-Capillary: 133 mg/dL — ABNORMAL HIGH (ref 70–99)

## 2024-03-06 SURGERY — ARTHROPLASTY, SHOULDER, TOTAL, REVERSE
Anesthesia: General | Site: Shoulder | Laterality: Right

## 2024-03-06 MED ORDER — PHENYLEPHRINE HCL-NACL 20-0.9 MG/250ML-% IV SOLN
INTRAVENOUS | Status: AC
  Filled 2024-03-06: qty 250

## 2024-03-06 MED ORDER — PROPOFOL 10 MG/ML IV BOLUS
INTRAVENOUS | Status: DC | PRN
Start: 2024-03-06 — End: 2024-03-06
  Administered 2024-03-06: 200 mg via INTRAVENOUS

## 2024-03-06 MED ORDER — LACTATED RINGERS IV SOLN: INTRAVENOUS | Status: DC

## 2024-03-06 MED ORDER — OXYCODONE HCL 5 MG PO TABS: 5.0000 mg | ORAL_TABLET | Freq: Once | ORAL | Status: DC | PRN

## 2024-03-06 MED ORDER — BUPIVACAINE HCL (PF) 0.5 % IJ SOLN
INTRAMUSCULAR | Status: DC | PRN
  Administered 2024-03-06: 10 mL via PERINEURAL

## 2024-03-06 MED ORDER — FENTANYL CITRATE (PF) 100 MCG/2ML IJ SOLN
INTRAMUSCULAR | Status: DC | PRN
  Administered 2024-03-06 (×2): 50 ug via INTRAVENOUS

## 2024-03-06 MED ORDER — ROCURONIUM BROMIDE 100 MG/10ML IV SOLN
INTRAVENOUS | Status: DC | PRN
  Administered 2024-03-06: 10 mg via INTRAVENOUS
  Administered 2024-03-06: 70 mg via INTRAVENOUS

## 2024-03-06 MED ORDER — ACETAMINOPHEN 500 MG PO TABS
1000.0000 mg | ORAL_TABLET | Freq: Once | ORAL | Status: AC
  Administered 2024-03-06: 1000 mg via ORAL
  Filled 2024-03-06: qty 2

## 2024-03-06 MED ORDER — ROCURONIUM BROMIDE 10 MG/ML (PF) SYRINGE
PREFILLED_SYRINGE | INTRAVENOUS | Status: AC
  Filled 2024-03-06: qty 10

## 2024-03-06 MED ORDER — OXYCODONE HCL 5 MG PO TABS: 5.0000 mg | ORAL_TABLET | ORAL | 0 refills | Status: AC | PRN

## 2024-03-06 MED ORDER — FENTANYL CITRATE (PF) 100 MCG/2ML IJ SOLN
INTRAMUSCULAR | Status: AC
  Filled 2024-03-06: qty 2

## 2024-03-06 MED ORDER — MIDAZOLAM HCL (PF) 2 MG/2ML IJ SOLN
1.0000 mg | INTRAMUSCULAR | Status: DC
  Administered 2024-03-06: 2 mg via INTRAVENOUS
  Filled 2024-03-06: qty 2

## 2024-03-06 MED ORDER — CEFAZOLIN SODIUM-DEXTROSE 3-4 GM/150ML-% IV SOLN
3.0000 g | INTRAVENOUS | Status: AC
  Administered 2024-03-06: 3 g via INTRAVENOUS
  Filled 2024-03-06: qty 150

## 2024-03-06 MED ORDER — LIDOCAINE HCL (CARDIAC) PF 100 MG/5ML IV SOSY
PREFILLED_SYRINGE | INTRAVENOUS | Status: DC | PRN
  Administered 2024-03-06: 100 mg via INTRAVENOUS

## 2024-03-06 MED ORDER — CHLORHEXIDINE GLUCONATE 0.12 % MT SOLN
15.0000 mL | Freq: Once | OROMUCOSAL | Status: AC
  Administered 2024-03-06: 15 mL via OROMUCOSAL

## 2024-03-06 MED ORDER — BUPIVACAINE LIPOSOME 1.3 % IJ SUSP
INTRAMUSCULAR | Status: DC | PRN
  Administered 2024-03-06: 10 mL via PERINEURAL

## 2024-03-06 MED ORDER — SODIUM CHLORIDE 0.9 % IR SOLN
Status: DC | PRN
  Administered 2024-03-06: 1000 mL

## 2024-03-06 MED ORDER — EPHEDRINE 5 MG/ML INJ
INTRAVENOUS | Status: AC
  Filled 2024-03-06: qty 5

## 2024-03-06 MED ORDER — ONDANSETRON HCL 4 MG/2ML IJ SOLN
INTRAMUSCULAR | Status: AC
  Filled 2024-03-06: qty 2

## 2024-03-06 MED ORDER — PROPOFOL 10 MG/ML IV BOLUS
INTRAVENOUS | Status: AC
  Filled 2024-03-06: qty 20

## 2024-03-06 MED ORDER — PHENYLEPHRINE 80 MCG/ML (10ML) SYRINGE FOR IV PUSH (FOR BLOOD PRESSURE SUPPORT)
PREFILLED_SYRINGE | INTRAVENOUS | Status: DC | PRN
  Administered 2024-03-06 (×2): 160 ug via INTRAVENOUS

## 2024-03-06 MED ORDER — OXYCODONE HCL 5 MG/5ML PO SOLN: 5.0000 mg | Freq: Once | ORAL | Status: DC | PRN

## 2024-03-06 MED ORDER — EPHEDRINE SULFATE (PRESSORS) 25 MG/5ML IV SOSY
PREFILLED_SYRINGE | INTRAVENOUS | Status: DC | PRN
  Administered 2024-03-06 (×2): 5 mg via INTRAVENOUS

## 2024-03-06 MED ORDER — ONDANSETRON HCL 4 MG/2ML IJ SOLN
INTRAMUSCULAR | Status: DC | PRN
  Administered 2024-03-06: 4 mg via INTRAVENOUS

## 2024-03-06 MED ORDER — LIDOCAINE HCL (PF) 2 % IJ SOLN
INTRAMUSCULAR | Status: AC
  Filled 2024-03-06: qty 5

## 2024-03-06 MED ORDER — AMISULPRIDE (ANTIEMETIC) 5 MG/2ML IV SOLN: 10.0000 mg | Freq: Once | INTRAVENOUS | Status: DC | PRN

## 2024-03-06 MED ORDER — FENTANYL CITRATE (PF) 50 MCG/ML IJ SOSY: 25.0000 ug | PREFILLED_SYRINGE | INTRAMUSCULAR | Status: DC | PRN

## 2024-03-06 MED ORDER — INSULIN ASPART 100 UNIT/ML IJ SOLN
0.0000 [IU] | INTRAMUSCULAR | Status: DC | PRN
  Administered 2024-03-06: 4 [IU] via SUBCUTANEOUS
  Filled 2024-03-06: qty 4

## 2024-03-06 MED ORDER — PHENYLEPHRINE HCL-NACL 20-0.9 MG/250ML-% IV SOLN
INTRAVENOUS | Status: DC | PRN
Start: 2024-03-06 — End: 2024-03-06
  Administered 2024-03-06: 50 ug/min via INTRAVENOUS

## 2024-03-06 MED ORDER — METHOCARBAMOL 500 MG PO TABS: 500.0000 mg | ORAL_TABLET | Freq: Four times a day (QID) | ORAL | 0 refills | Status: AC | PRN

## 2024-03-06 MED ORDER — FENTANYL CITRATE (PF) 50 MCG/ML IJ SOSY
50.0000 ug | PREFILLED_SYRINGE | INTRAMUSCULAR | Status: DC
  Administered 2024-03-06: 50 ug via INTRAVENOUS
  Filled 2024-03-06: qty 2

## 2024-03-06 MED ORDER — SUGAMMADEX SODIUM 200 MG/2ML IV SOLN
INTRAVENOUS | Status: DC | PRN
  Administered 2024-03-06: 400 mg via INTRAVENOUS

## 2024-03-06 MED ORDER — PHENYLEPHRINE 80 MCG/ML (10ML) SYRINGE FOR IV PUSH (FOR BLOOD PRESSURE SUPPORT)
PREFILLED_SYRINGE | INTRAVENOUS | Status: AC
  Filled 2024-03-06: qty 10

## 2024-03-06 MED ORDER — TRANEXAMIC ACID-NACL 1000-0.7 MG/100ML-% IV SOLN
1000.0000 mg | INTRAVENOUS | Status: AC
Start: 2024-03-06 — End: 2024-03-06
  Administered 2024-03-06: 1000 mg via INTRAVENOUS
  Filled 2024-03-06: qty 100

## 2024-03-06 MED ORDER — ORAL CARE MOUTH RINSE: 15.0000 mL | Freq: Once | OROMUCOSAL | Status: AC

## 2024-03-06 SURGICAL SUPPLY — 61 items
BAG COUNTER SPONGE SURGICOUNT (BAG) IMPLANT
BAG ZIPLOCK 12X15 (MISCELLANEOUS) ×1 IMPLANT
BASEPLATE GLEN ALTIVATE 6.5 (Joint) IMPLANT
BLADE SAW SGTL 73X25 THK (BLADE) ×1 IMPLANT
BOOTIES KNEE HIGH SLOAN (MISCELLANEOUS) ×2 IMPLANT
COOLER ICEMAN CLASSIC (MISCELLANEOUS) ×1 IMPLANT
COVER BACK TABLE 60X90IN (DRAPES) ×1 IMPLANT
COVER SURGICAL LIGHT HANDLE (MISCELLANEOUS) ×1 IMPLANT
DRAPE INCISE IOBAN 66X45 STRL (DRAPES) ×1 IMPLANT
DRAPE POUCH INSTRU U-SHP 10X18 (DRAPES) ×1 IMPLANT
DRAPE SHEET LG 3/4 BI-LAMINATE (DRAPES) ×1 IMPLANT
DRAPE SURG 17X11 SM STRL (DRAPES) ×1 IMPLANT
DRAPE SURG ORHT 6 SPLT 77X108 (DRAPES) ×2 IMPLANT
DRAPE TOP 10253 STERILE (DRAPES) ×1 IMPLANT
DRAPE U-SHAPE 47X51 STRL (DRAPES) ×1 IMPLANT
DRILL GLEN ALTIVATE 3.5 (DRILL) IMPLANT
DRSG AQUACEL AG ADV 3.5X 6 (GAUZE/BANDAGES/DRESSINGS) ×1 IMPLANT
DURAPREP 26ML APPLICATOR (WOUND CARE) ×2 IMPLANT
ELECT BLADE TIP CTD 4 INCH (ELECTRODE) ×1 IMPLANT
ELECT PENCIL ROCKER SW 15FT (MISCELLANEOUS) ×1 IMPLANT
ELECT REM PT RETURN 15FT ADLT (MISCELLANEOUS) ×1 IMPLANT
FACESHIELD WRAPAROUND OR TEAM (MASK) ×1 IMPLANT
GLOVE BIO SURGEON STRL SZ7.5 (GLOVE) ×1 IMPLANT
GLOVE BIOGEL PI IND STRL 6.5 (GLOVE) ×1 IMPLANT
GLOVE BIOGEL PI IND STRL 8 (GLOVE) ×1 IMPLANT
GLOVE SURG SS PI 6.5 STRL IVOR (GLOVE) ×1 IMPLANT
GOWN STRL REUS W/ TWL LRG LVL3 (GOWN DISPOSABLE) ×1 IMPLANT
GOWN STRL REUS W/ TWL XL LVL3 (GOWN DISPOSABLE) ×1 IMPLANT
GUIDE WIRE ALTIVATE 2.4X228 SL (WIRE) IMPLANT
HEAD GLENOID W/SCREW 32MM (Shoulder) IMPLANT
HOOD PEEL AWAY T7 (MISCELLANEOUS) ×3 IMPLANT
INSERT EPOLYSTD HUMERUS+4 36 (Shoulder) IMPLANT
KIT BASIN OR (CUSTOM PROCEDURE TRAY) ×1 IMPLANT
KIT TURNOVER KIT A (KITS) ×1 IMPLANT
MANIFOLD NEPTUNE II (INSTRUMENTS) ×1 IMPLANT
NDL TROCAR POINT SZ 2 1/2 (NEEDLE) IMPLANT
NEEDLE TROCAR POINT SZ 2 1/2 (NEEDLE) ×1 IMPLANT
NS IRRIG 1000ML POUR BTL (IV SOLUTION) ×1 IMPLANT
PACK SHOULDER (CUSTOM PROCEDURE TRAY) ×1 IMPLANT
PAD COLD SHLDR WRAP-ON (PAD) ×1 IMPLANT
RESTRAINT HEAD UNIVERSAL NS (MISCELLANEOUS) ×1 IMPLANT
RETRIEVER SUT HEWSON (MISCELLANEOUS) IMPLANT
SCREW CENTR ALTIVATE 6.5X40 (Screw) IMPLANT
SCREW PERI ALTIVATE REV 14 (Screw) IMPLANT
SCREW PERI ALTIVATE REV 22 (Screw) IMPLANT
SCREW PERI ALTIVATE REV 34 (Screw) IMPLANT
SET HNDPC FAN SPRY TIP SCT (DISPOSABLE) ×1 IMPLANT
SLING ARM FOAM STRAP LRG (SOFTGOODS) IMPLANT
SLING ARM FOAM STRAP MED (SOFTGOODS) IMPLANT
STEM HUMERAL STD SHELL 16X48 (Miscellaneous) IMPLANT
STRIP CLOSURE SKIN 1/2X4 (GAUZE/BANDAGES/DRESSINGS) ×1 IMPLANT
SUCTION TUBE FRAZIER 10FR DISP (SUCTIONS) IMPLANT
SUPPORT WRAP ARM LG (MISCELLANEOUS) ×1 IMPLANT
SUT ETHIBOND 2 V 37 (SUTURE) IMPLANT
SUT MNCRL AB 4-0 PS2 18 (SUTURE) ×1 IMPLANT
SUT VIC AB 2-0 CT1 TAPERPNT 27 (SUTURE) ×2 IMPLANT
SUTURE FIBERWR#2 38 REV NDL BL (SUTURE) IMPLANT
TAP CANN GLEN 6.5 (TAP) IMPLANT
TAPE SUT LABRALTAP WHT/BLK (SUTURE) IMPLANT
TOWEL OR 17X26 10 PK STRL BLUE (TOWEL DISPOSABLE) ×1 IMPLANT
TUBE SUCTION HIGH CAP CLEAR NV (SUCTIONS) ×1 IMPLANT

## 2024-03-06 NOTE — Anesthesia Procedure Notes (Signed)
 Procedure Name: Intubation Date/Time: 03/06/2024 9:58 AM  Performed by: Nada Corean CROME, CRNAPre-anesthesia Checklist: Suction available, Emergency Drugs available, Patient identified, Patient being monitored and Timeout performed Patient Re-evaluated:Patient Re-evaluated prior to induction Oxygen Delivery Method: Circle system utilized Preoxygenation: Pre-oxygenation with 100% oxygen Induction Type: IV induction Ventilation: Two handed mask ventilation required Laryngoscope Size: Glidescope Grade View: Grade I Tube type: Oral Tube size: 7.5 mm Number of attempts: 1 Airway Equipment and Method: Stylet and Video-laryngoscopy Placement Confirmation: ETT inserted through vocal cords under direct vision, positive ETCO2 and breath sounds checked- equal and bilateral Secured at: 24 cm Tube secured with: Tape Dental Injury: Teeth and Oropharynx as per pre-operative assessment

## 2024-03-06 NOTE — H&P (Addendum)
 Joshua [name] is an 53 y.o. male.   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  Past Medical History:  Diagnosis Date   Arthritis    wrists, knees, back, shoulders; everywhere (11/01/2016)   Chronic lower back pain    Daily headache    recently; related to pain in my left knee (11/01/2016)   Diabetes mellitus type 2, uncomplicated (HCC) 2024   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)    History of blood clots    History of kidney stones    last flare up 2017   HTN (hypertension)    Hypertension    Migraine    might get 1/month now (11/01/2016)   OA (osteoarthritis) of knee    unilateral primary OA of left knee   Obesity    OSA (obstructive sleep apnea)    don't wear the mask (11/01/2016)   Personal history of thrombophlebitis    back in 2016    Past Surgical History:  Procedure Laterality Date   BACK SURGERY  1987/2005/2009; 03/25/2010; 2012 X 2; ?other dates   I've had 10 total   CARPAL TUNNEL RELEASE Bilateral    FOREARM SURGERY Right 2010   blood clots from my wrist to elbow (11/01/2016)   HAND SURGERY Right 2010   aneurysm in my palm   INCISION AND DRAINAGE OF WOUND     lumbar   JOINT REPLACEMENT     LITHOTRIPSY  X 2   LUMBAR DISC SURGERY  ~ 6   LUMBAR SPINE HARDWARE REMOVAL  X 1   POSTERIOR LUMBAR FUSION  X 2   SHOULDER ARTHROSCOPY W/ ROTATOR CUFF REPAIR Right 2010   TOTAL KNEE ARTHROPLASTY Left 10/31/2016   Procedure: LEFT TOTAL KNEE ARTHROPLASTY;  Surgeon: Vernetta Lonni GRADE, MD;  Location[facility];  Service: Orthopedics;  Laterality: Left;   TOTAL KNEE ARTHROPLASTY Right 07/03/2017   Procedure: RIGHT TOTAL KNEE ARTHROPLASTY;  Surgeon: Vernetta Lonni GRADE, MD;  Location[facility];  Service: Orthopedics;  Laterality: Right;    Family History  Problem Relation Age of Onset   Hypertension Mother    Cancer Mother    Heart disease  Father    COPD Father    Heart disease Brother    Cancer Son    Healthy Son    Healthy Son    Social History:  reports that he has never smoked. He has been exposed to tobacco smoke. He has never used smokeless tobacco. He reports that he does not drink alcohol and does not use drugs.  Allergies: No Known Allergies  Medications Prior to Admission  Medication Sig Dispense Refill   acetaminophen  (TYLENOL ) 500 MG tablet Take 2,000 mg by mouth every 6 (six) hours as needed for mild pain (pain score 1-3) or moderate pain (pain score 4-6).     amLODipine (NORVASC) 5 MG tablet Take 5 mg by mouth daily.     atorvastatin (LIPITOR) 10 MG tablet Take 10 mg by mouth daily.     DUPIXENT 300 MG/2ML SOAJ Inject 300 mg into the skin every 14 (fourteen) days.     oxyCODONE  (OXY IR/ROXICODONE ) 5 MG immediate release tablet Take 5 mg by mouth every 6 (six) hours as needed for moderate pain (pain score 4-6) or severe pain (pain score 7-10).     pregabalin (LYRICA) 200 MG capsule Take 200 mg by mouth daily as needed (nerve pain in  back and shoulders).     rivaroxaban  (XARELTO ) 10 MG TABS tablet Take 1 tablet (10 mg total) by mouth daily. (Patient taking differently: Take 20 mg by mouth daily.) 14 tablet 0   valsartan-hydrochlorothiazide  (DIOVAN-HCT) 320-12.5 MG tablet Take 1 tablet by mouth daily.      Results for orders placed or performed during the hospital encounter of 03/06/24 (from the past 48 hours)  Glucose, capillary     Status: Abnormal   Collection Time: 03/06/24  7:45 AM  Result Value Ref Range   Glucose-Capillary 197 (H) 70 - 99 mg/dL    Comment: Glucose reference range applies only to samples taken after fasting for at least 8 hours.   No results found.  Review of Systems  All other systems reviewed and are negative.   Blood pressure 123/78, pulse (!) 106, temperature 98.2 F (36.8 C), temperature source Oral, resp. rate 15, height 6' (1.829 m), weight (!) 139.7 kg, SpO2 96%. Physical  Exam Constitutional:      Appearance: He is well-developed.  HENT:     Head: Atraumatic.  Eyes:     Extraocular Movements: Extraocular movements intact.  Cardiovascular:     Pulses: Normal pulses.  Pulmonary:     Effort: Pulmonary effort is normal.  Musculoskeletal:     Comments: R shoulder pain with limited ROM. NVID  Skin:    General: Skin is warm and dry.  Neurological:     Mental Status: He is alert and oriented to person, place, and time.  Psychiatric:        Mood and Affect: Mood normal.      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 Plan R Reverse TSA Risks / benefits of surgery discussed Consent on chart  NPO for OR Preop antibiotics   Josefa LELON Herring, MD 03/06/2024, 9:07 AM

## 2024-03-06 NOTE — Anesthesia Postprocedure Evaluation (Signed)
 Anesthesia Post Note  Patient: Joshua [name]  Procedure(s) Performed: ARTHROPLASTY, SHOULDER, TOTAL, REVERSE (Right: Shoulder)     Patient location during evaluation: PACU Anesthesia Type: General Level of consciousness: awake Pain management: pain level controlled Vital Signs Assessment: post-procedure vital signs reviewed and stable Respiratory status: spontaneous breathing, nonlabored ventilation and respiratory function stable Cardiovascular status: blood pressure returned to baseline and stable Postop Assessment: no apparent nausea or vomiting Anesthetic complications: no   No notable events documented.  Last Vitals:  Vitals:   03/06/24 1245 03/06/24 1300  BP: 111/74 112/65  Pulse: (!) 104 (!) 102  Resp: 19 (!) 23  Temp:    SpO2: (!) 88% (!) 89%    Last Pain:  Vitals:   03/06/24 1300  TempSrc:   PainSc: 0-No pain                 Delon Aisha Arch

## 2024-03-06 NOTE — Op Note (Signed)
 Procedure(s): ARTHROPLASTY, SHOULDER, TOTAL, REVERSE Procedure Note  DONAVIN AUDINO male 53 y.o. 03/06/2024  Preoperative diagnosis: Right shoulder end-stage arthritis with rotator cuff disease and posterior glenoid wear  Postoperative diagnosis: Same   Procedure(s) and Anesthesia Type:    * ARTHROPLASTY, SHOULDER, TOTAL, REVERSE - General   Indications:  53 y.o. male  With endstage right shoulder arthritis with rotator cuff pathology and significant posterior glenoid bone loss. Pain and dysfunction interfered with quality of life and nonoperative treatment with activity modification, NSAIDS and injections failed.     Surgeon: Josefa LELON Herring   Assistants: Jeoffrey Northern PA-C Amber was present and scrubbed throughout the procedure and was essential in positioning, retraction, exposure, and closure)  Anesthesia: General endotracheal anesthesia with preoperative interscalene block given by the attending anesthesiologist    Procedure Detail  ARTHROPLASTY, SHOULDER, TOTAL, REVERSE   Estimated Blood Loss:  300 mL         Drains: none  Blood Given: none          Specimens: none        Complications:  * No complications entered in OR log *         Disposition: PACU - hemodynamically stable.         Condition: stable      OPERATIVE FINDINGS:  A DJO Altivate pressfit reverse total shoulder arthroplasty was placed with a  size 16 stem, a 36 standard glenosphere, and a +4-mm poly insert.  A full wedge augment was used with the augment posteriorly . the base plate  fixation was excellent.  PROCEDURE: The patient was identified in the preoperative holding area  where I personally marked the operative site after verifying site, side,  and procedure with the patient. An interscalene block given by  the attending anesthesiologist in the holding area and the patient was taken back to the operating room where all extremities were  carefully padded in position after general  anesthesia was induced. She  was placed in a beach-chair position and the operative upper extremity was  prepped and draped in a standard sterile fashion. An approximately 10-  cm incision was made from the tip of the coracoid process to the center  point of the humerus at the level of the axilla. Dissection was carried  down through subcutaneous tissues to the level of the cephalic vein  which was taken laterally with the deltoid. The pectoralis major was  retracted medially. The subdeltoid space was developed and the lateral  edge of the conjoined tendon was identified. The undersurface of  conjoined tendon was palpated and the musculocutaneous nerve was not in  the field. Retractor was placed underneath the conjoined and second  retractor was placed lateral into the deltoid. The circumflex humeral  artery and vessels were identified and clamped and coagulated. The  biceps tendon was tenodesed to the upper border of the pectoralis major.  The subscapularis was taken down as a peel.  The  joint was then gently externally rotated while the capsule was released  from the humeral neck around to just beyond the 6 o'clock position. At  this point, the joint was dislocated and the humeral head was presented  into the wound. The excessive osteophyte formation was removed with a  large rongeur.  The cutting guide was used to make the appropriate  head cut and the head was saved for potentially bone grafting.  The glenoid was exposed with the arm in an  abducted extended position. The anterior  and posterior labrum were  completely excised and the capsule was released circumferentially to  allow for exposure of the glenoid for preparation.  The angled guide was used to place the central pin.  The angled reamer was then used to ream the glenoid.  The anterior hole was drilled.  The central tap was used.  The wedge augmented baseplate was impacted and the central screw was placed with excellent fixation.   The [facility] taper was impacted.   The peripheral guide was then used to drilled measured and filled peripheral locking screws. The size 36 standard glenosphere was then impacted on the [facility] taper and the central screw was placed. The humerus was then again exposed and the diaphyseal reamers were used followed by the metaphyseal reamers. The final broach was left in place in the proximal trial was placed. The joint was reduced and with this implant it was felt that soft tissue tensioning was appropriate with excellent stability and excellent range of motion. Therefore, final humeral stem was placed press-fit.  And then the trial polyethylene inserts were tested again and the above implant was felt to be the most appropriate for final insertion. The joint was reduced taken through full range of motion and felt to be stable. Soft tissue tension was appropriate.  The joint was then copiously irrigated with pulse  lavage and the wound was then closed. The subscapularis was repaired with labral tapes placed through drill holes and around the implant.  Skin was closed with 2-0 Vicryl in a deep dermal layer and 4-0  Monocryl for skin closure. Steri-Strips were applied. Sterile  dressings were then applied as well as a sling. The patient was allowed  to awaken from general anesthesia, transferred to stretcher, and taken  to recovery room in stable condition.   POSTOPERATIVE PLAN: The patient will be observed in the recovery room and if pain is well-controlled with regional anesthesia and he is hemodynamically stable he can be discharged home today with family.

## 2024-03-06 NOTE — Evaluation (Signed)
 Occupational Therapy Evaluation Patient Details Name: Joshua [name] MRN: [id number] DOB: 09-15-1970 Today's Date: 03/06/2024   History of Present Illness   Pt is a 53 year old man admitted for R reverse TSA. PMH: arthritis, multiple orthopedic surgeries and joint replacements, HTN, DM, UE DVT.     Clinical Impressions All education completed with pt and wife verbalizing and/or demonstrating understanding. Reinforced education with written handouts.      If plan is discharge home, recommend the following:         Functional Status Assessment   Patient has had a recent decline in their functional status and demonstrates the ability to make significant improvements in function in a reasonable and predictable amount of time.     Equipment Recommendations   None recommended by OT     Recommendations for Other Services         Precautions/Restrictions   Precautions Precautions: Shoulder Type of Shoulder Precautions: no shoulder exercises, active elbow to hand ROM Shoulder Interventions: Shoulder sling/immobilizer;Off for dressing/bathing/exercises Precaution Booklet Issued: Yes (comment) Required Braces or Orthoses: Sling Restrictions Weight Bearing Restrictions Per Provider Order: Yes RUE Weight Bearing Per Provider Order: Non weight bearing     Mobility Bed Mobility                    Transfers Overall transfer level: Independent                        Balance                                           ADL either performed or assessed with clinical judgement   ADL                                         General ADL Comments: Educated pt and wife in donning and doffing sling, sling wearing schedule, positioning R UE in bed and chair, compensatory strategies for ADLs and use of iceman.     Vision         Perception         Praxis         Pertinent Vitals/Pain Pain Assessment Pain  Assessment: Faces Faces Pain Scale: Hurts little more Pain Location: back Pain Descriptors / Indicators: Sore Pain Intervention(s): Monitored during session, Repositioned     Extremity/Trunk Assessment Upper Extremity Assessment Upper Extremity Assessment: Right hand dominant;RUE deficits/detail RUE Deficits / Details: educated in elbow to hand AROM, nerve block intact RUE Coordination: decreased gross motor   Lower Extremity Assessment Lower Extremity Assessment: Overall WFL for tasks assessed   Cervical / Trunk Assessment Cervical / Trunk Assessment: Other exceptions (chronic back pain)   Communication Communication Communication: No apparent difficulties   Cognition Arousal: Alert Behavior During Therapy: WFL for tasks assessed/performed Cognition: No apparent impairments                               Following commands: Intact       Cueing  General Comments   Cueing Techniques: Verbal cues      Exercises     Shoulder Instructions      Home Living Family/patient expects to be discharged to::  Private residence Living Arrangements: Spouse/significant other Available Help at Discharge: Family;Available PRN/intermittently                                    Prior Functioning/Environment Prior Level of Function : Independent/Modified Independent;Driving                    OT Problem List:     OT Treatment/Interventions:        OT Goals(Current goals can be found in the care plan section)       OT Frequency:       Co-evaluation              AM-PAC OT 6 Clicks Daily Activity     Outcome Measure Help from another person eating meals?: A Little Help from another person taking care of personal grooming?: A Little Help from another person toileting, which includes using toliet, bedpan, or urinal?: A Little Help from another person bathing (including washing, rinsing, drying)?: A Little Help from another person  to put on and taking off regular upper body clothing?: A Little Help from another person to put on and taking off regular lower body clothing?: A Little 6 Click Score: 18   End of Session    Activity Tolerance: Patient tolerated treatment well Patient left: in chair;with nursing/sitter in room;with family/visitor present  OT Visit Diagnosis: Muscle weakness (generalized) (M62.81)                Time: 1430-1455 OT Time Calculation (min): 25 min Charges:  OT General Charges $OT Visit: 1 Visit OT Evaluation $OT Eval Low Complexity: 1 Low OT Treatments $Therapeutic Activity: 8-22 mins  Mliss HERO, OTR/L Acute Rehabilitation Services Office: [phone]   Kennth Mliss Helling 03/06/2024, 3:07 PM

## 2024-03-06 NOTE — Anesthesia Procedure Notes (Signed)
 Anesthesia Regional Block: Interscalene brachial plexus block   Pre-Anesthetic Checklist: , timeout performed,  Correct Patient, Correct Site, Correct Laterality,  Correct Procedure, Correct Position, site marked,  Risks and benefits discussed,  Surgical consent,  Pre-op evaluation,  At surgeon's request and post-op pain management  Laterality: Right  Prep: chloraprep       Needles:  Injection technique: Single-shot  Needle Type: Echogenic Stimulator Needle     Needle Length: 9cm  Needle Gauge: 21     Additional Needles:   Procedures:,,,, ultrasound used (permanent image in chart),,    Narrative:  Start time: 03/06/2024 9:13 AM End time: 03/06/2024 9:18 AM Injection made incrementally with aspirations every 5 mL.  Performed by: Personally  Anesthesiologist: Peggye Delon Brunswick, MD  Additional Notes: Discussed risks and benefits of nerve block including, but not limited to, prolonged and/or permanent nerve injury involving sensory and/or motor function. Monitors were applied and a time-out was performed. The nerve and associated structures were visualized under ultrasound guidance. After negative aspiration, local anesthetic was slowly injected around the nerve. There was no evidence of high pressure during the procedure. There were no paresthesias. VSS remained stable and the patient tolerated the procedure well.

## 2024-03-06 NOTE — Discharge Instructions (Addendum)
Discharge Instructions after Reverse Total Shoulder Arthroplasty   A sling has been provided for you. You are to wear this at all times (except for bathing and dressing), until your first post operative visit with Dr. Chandler. Please also wear while sleeping at night. While you bath and dress, let the arm/elbow extend straight down to stretch your elbow. Wiggle your fingers and pump your first while your in the sling to prevent hand swelling. Use ice on the shoulder intermittently over the first 48 hours after surgery. Continue to use ice or and ice machine as needed after 48 hours for pain control/swelling.  Pain medicine has been prescribed for you.  Use your medicine liberally over the first 48 hours, and then you can begin to taper your use. You may take Extra Strength Tylenol or Tylenol only in place of the pain pills. DO NOT take ANY nonsteroidal anti-inflammatory pain medications: Advil, Motrin, Ibuprofen, Aleve, Naproxen or Naprosyn.  Resume Xarelto the day after surgery Leave your dressing on until your first follow up visit.  You may shower with the dressing.  Hold your arm as if you still have your sling on while you shower. Simply allow the water to wash over the site and then pat dry. Make sure your axilla (armpit) is completely dry after showering.    Please call [phone] during normal business hours or [phone] after hours for any problems. Including the following:  - excessive redness of the incisions - drainage for more than 4 days - fever of more than 101.5 F  *Please note that pain medications will not be refilled after hours or on weekends.    Dental Antibiotics:  In most cases prophylactic antibiotics for Dental procdeures after total joint surgery are not necessary.  Exceptions are as follows:  1. History of prior total joint infection  2. Severely immunocompromised (Organ Transplant, cancer chemotherapy, Rheumatoid biologic meds such as Humera)  3.  Poorly controlled diabetes (A1C &gt; 8.0, blood glucose over 200)  If you have one of these conditions, contact your surgeon for an antibiotic prescription, prior to your dental procedure.  

## 2024-03-06 NOTE — Transfer of Care (Signed)
 Immediate Anesthesia Transfer of Care Note  Patient: Joshua [name]  Procedure(s) Performed: ARTHROPLASTY, SHOULDER, TOTAL, REVERSE (Right: Shoulder)  Patient Location: PACU  Anesthesia Type:General and Regional  Level of Consciousness: drowsy, patient cooperative, and responds to stimulation  Airway & Oxygen Therapy: Patient Spontanous Breathing and Patient connected to face mask oxygen  Post-op Assessment: Report given to RN and Post -op Vital signs reviewed and stable  Post vital signs: Reviewed and stable  Last Vitals:  Vitals Value Taken Time  BP 145/84 03/06/24 11:53  Temp    Pulse 104 03/06/24 11:58  Resp 19 03/06/24 11:58  SpO2 92 % 03/06/24 11:58  Vitals shown include unfiled device data.  Last Pain:  Vitals:   03/06/24 0749  TempSrc:   PainSc: 0-No pain         Complications: No notable events documented.

## 2024-03-07 ENCOUNTER — Encounter (HOSPITAL_COMMUNITY): Payer: Self-pay | Admitting: Orthopedic Surgery
